# Patient Record
Sex: Female | Born: 1970
Health system: Southern US, Community
[De-identification: ages and names within clinical notes are randomized; demographics above are authoritative.]

## PROBLEM LIST (undated history)

## (undated) DIAGNOSIS — R000 Tachycardia, unspecified: Secondary | ICD-10-CM

## (undated) DIAGNOSIS — F419 Anxiety disorder, unspecified: Secondary | ICD-10-CM

## (undated) DIAGNOSIS — F4310 Post-traumatic stress disorder, unspecified: Secondary | ICD-10-CM

## (undated) DIAGNOSIS — K219 Gastro-esophageal reflux disease without esophagitis: Secondary | ICD-10-CM

## (undated) HISTORY — PX: LAPAROSCOPIC OOPHERECTOMY: SHX6507

## (undated) HISTORY — PX: VAGINAL HYSTERECTOMY: SUR661

## (undated) HISTORY — PX: CHOLECYSTECTOMY: SHX55

---

## 2003-10-15 ENCOUNTER — Other Ambulatory Visit: Payer: Self-pay

## 2003-11-22 ENCOUNTER — Ambulatory Visit: Payer: Self-pay | Admitting: Family Medicine

## 2004-10-17 ENCOUNTER — Emergency Department: Payer: Self-pay | Admitting: Internal Medicine

## 2005-10-06 ENCOUNTER — Ambulatory Visit: Payer: Self-pay | Admitting: Unknown Physician Specialty

## 2005-10-08 ENCOUNTER — Ambulatory Visit: Payer: Self-pay | Admitting: Unknown Physician Specialty

## 2006-06-24 ENCOUNTER — Ambulatory Visit: Payer: Self-pay | Admitting: Specialist

## 2006-07-16 ENCOUNTER — Emergency Department: Payer: Self-pay | Admitting: Emergency Medicine

## 2006-07-16 ENCOUNTER — Other Ambulatory Visit: Payer: Self-pay

## 2008-03-14 ENCOUNTER — Ambulatory Visit: Payer: Self-pay | Admitting: Cardiology

## 2008-05-22 ENCOUNTER — Ambulatory Visit: Payer: Self-pay | Admitting: Gastroenterology

## 2008-07-13 ENCOUNTER — Ambulatory Visit: Payer: Self-pay | Admitting: Surgery

## 2008-07-19 ENCOUNTER — Inpatient Hospital Stay: Payer: Self-pay | Admitting: Surgery

## 2009-04-06 ENCOUNTER — Emergency Department: Payer: Self-pay | Admitting: Emergency Medicine

## 2010-11-12 ENCOUNTER — Ambulatory Visit: Payer: Self-pay | Admitting: Family Medicine

## 2010-11-24 ENCOUNTER — Ambulatory Visit: Payer: Self-pay | Admitting: Family Medicine

## 2011-06-27 ENCOUNTER — Emergency Department: Payer: Self-pay | Admitting: Emergency Medicine

## 2011-06-30 ENCOUNTER — Ambulatory Visit: Payer: Self-pay | Admitting: Family Medicine

## 2011-07-09 ENCOUNTER — Ambulatory Visit: Payer: Self-pay | Admitting: Family Medicine

## 2011-08-24 ENCOUNTER — Observation Stay: Payer: Self-pay | Admitting: Student

## 2011-08-24 LAB — CBC WITH DIFFERENTIAL/PLATELET
Basophil #: 0.1 10*3/uL (ref 0.0–0.1)
Eosinophil #: 0.1 10*3/uL (ref 0.0–0.7)
HCT: 39.3 % (ref 35.0–47.0)
Lymphocyte #: 3 10*3/uL (ref 1.0–3.6)
Lymphocyte %: 28.3 %
MCH: 28.1 pg (ref 26.0–34.0)
MCHC: 32.4 g/dL (ref 32.0–36.0)
MCV: 87 fL (ref 80–100)
Monocyte #: 0.5 x10 3/mm (ref 0.2–0.9)
Monocyte %: 5 %
Neutrophil #: 6.9 10*3/uL — ABNORMAL HIGH (ref 1.4–6.5)
Platelet: 224 10*3/uL (ref 150–440)
RBC: 4.55 10*6/uL (ref 3.80–5.20)
RDW: 13 % (ref 11.5–14.5)
WBC: 10.6 10*3/uL (ref 3.6–11.0)

## 2011-08-24 LAB — COMPREHENSIVE METABOLIC PANEL
Albumin: 3.6 g/dL (ref 3.4–5.0)
Alkaline Phosphatase: 48 U/L — ABNORMAL LOW (ref 50–136)
Anion Gap: 14 (ref 7–16)
BUN: 14 mg/dL (ref 7–18)
Chloride: 107 mmol/L (ref 98–107)
EGFR (African American): >60
Glucose: 140 mg/dL — ABNORMAL HIGH (ref 65–99)
Osmolality: 288 (ref 275–301)
Potassium: 3.3 mmol/L — ABNORMAL LOW (ref 3.5–5.1)
SGOT(AST): 24 U/L (ref 15–37)
Sodium: 143 mmol/L (ref 136–145)
Total Protein: 6.8 g/dL (ref 6.4–8.2)

## 2011-08-24 LAB — URINALYSIS, COMPLETE
Bilirubin,UR: NEGATIVE
Blood: NEGATIVE
Ph: 9 (ref 4.5–8.0)
RBC,UR: 1 /HPF (ref 0–5)
Specific Gravity: 1.008 (ref 1.003–1.030)
Squamous Epithelial: NONE SEEN

## 2011-08-24 LAB — CK TOTAL AND CKMB (NOT AT ARMC)
CK, Total: 105 U/L (ref 21–215)
CK-MB: 1.2 ng/mL (ref 0.5–3.6)
CK-MB: 1 ng/mL (ref 0.5–3.6)

## 2011-08-24 LAB — TROPONIN I: Troponin-I: <0.02 ng/mL

## 2011-08-24 LAB — PROTIME-INR: Prothrombin Time: 12.2 secs (ref 11.5–14.7)

## 2011-11-26 ENCOUNTER — Ambulatory Visit: Payer: Self-pay | Admitting: Family Medicine

## 2011-12-02 ENCOUNTER — Ambulatory Visit: Payer: Self-pay | Admitting: Family Medicine

## 2012-06-01 ENCOUNTER — Ambulatory Visit: Payer: Self-pay | Admitting: Family Medicine

## 2012-07-07 ENCOUNTER — Ambulatory Visit: Payer: Self-pay | Admitting: Family Medicine

## 2013-06-12 ENCOUNTER — Ambulatory Visit: Payer: Self-pay | Admitting: Family Medicine

## 2014-02-13 DIAGNOSIS — F4310 Post-traumatic stress disorder, unspecified: Secondary | ICD-10-CM | POA: Diagnosis not present

## 2014-02-13 DIAGNOSIS — F419 Anxiety disorder, unspecified: Secondary | ICD-10-CM | POA: Diagnosis not present

## 2014-04-02 DIAGNOSIS — J019 Acute sinusitis, unspecified: Secondary | ICD-10-CM | POA: Diagnosis not present

## 2014-04-02 DIAGNOSIS — J00 Acute nasopharyngitis [common cold]: Secondary | ICD-10-CM | POA: Diagnosis not present

## 2014-04-03 DIAGNOSIS — E875 Hyperkalemia: Secondary | ICD-10-CM | POA: Diagnosis not present

## 2014-04-03 DIAGNOSIS — R002 Palpitations: Secondary | ICD-10-CM | POA: Diagnosis not present

## 2014-05-22 NOTE — Consult Note (Signed)
PATIENT NAME:  Ariel Bryant, SPIEWAK MR#:  [id number] DATE OF BIRTH:  02-Jul-1970  DATE OF CONSULTATION:  08/24/2011  REFERRING PHYSICIAN:  Dr. Bridgette Habermann  CONSULTING PHYSICIAN:  Corey Skains, MD  REASON FOR CONSULTATION: Syncope with palpitations and hypertension.   CHIEF COMPLAINT: "I have palpitations."   HISTORY OF PRESENT ILLNESS: This is a 44 year old female with known palpitations and  possible irregular heart beat with preventricular contractions, who has had an echocardiogram and stress test last year showing no evidence of significant rhythm disturbances other than preventricular contractions. The patient has had some borderline hypertension but has not  required further intervention. In addition to that, the patient has been working on her cholesterol control with diet. The patient had a new onset of episodes of palpitations, irregular heartbeat, and weakness over the weekend with which she awakened in the morning and went to the bathroom. At that time she had some weakness and dizziness, chest discomfort, and passed out. After she passed out she did have some improvement of her symptoms and was seen in the Emergency Room. After that she did not have any further episodes of significant symptoms of chest pain or shortness of breath. Troponin and CK-MB are within normal limits. The patient has had an EKG showing normal sinus rhythm.   REVIEW OF SYSTEMS: The remainder of her review of systems is negative for vision change, ringing in the ears, hearing loss, cough, congestion, heartburn, nausea, vomiting, diarrhea, bloody stools, stomach pain, extremity pain, leg weakness, cramping of the buttocks, known blood clots, headaches, blackouts, dizzy spells, nosebleed, congestion, trouble swallowing, frequent urination, urination at night, muscle weakness, numbness, anxiety, depression, skin lesions, or skin rashes.   PAST MEDICAL HISTORY:  1. Palpitations with preventricular contractions.   2. Hypertension.  3. Hyperlipidemia.  4. Gastroesophageal reflux.   FAMILY HISTORY: No family members with early onset of cardiovascular disease.   SOCIAL HISTORY: Currently denies alcohol or tobacco use.   ALLERGIES: No known drug allergies.   CURRENT MEDICATIONS: As listed.   PHYSICAL EXAMINATION:  VITAL SIGNS: Blood pressure 126/68 bilaterally, heart rate 72 upright, reclining, and regular.   GENERAL: She is a well-appearing female in no acute distress.   HEENT: No icterus, thyromegaly, ulcers, hemorrhage, or xanthelasma.   HEART: Regular rate and rhythm. Normal S1, S2 without murmur, gallop, or rub. Point of maximal impulse is normal size and placement. Carotid upstroke normal without bruit. Jugular venous pressure is normal.   LUNGS: Lungs have few basilar crackles with normal respirations.   ABDOMEN: Soft, nontender, without hepatosplenomegaly or masses. Abdominal aorta is normal size without bruit.   EXTREMITIES: 2+ bilateral pulses in dorsal pedal, radial, and femoral arteries without lower extremity edema, cyanosis, clubbing, or ulcers.   NEUROLOGIC: She is oriented to time, place, and person with normal mood and affect.   ASSESSMENT: This is a 43 year old female with borderline hypertension, hyperlipidemia, palpitations, with possible rhythm disturbances, no evidence of significant further symptoms at this time and no evidence of EKG changes or myocardial infarction.   RECOMMENDATIONS:  1. Continue to monitor, following for rhythm disturbances.  2. Treadmill EKG to assess for myocardial infarction and rhythm disturbances.  3. Consider a monitor at home for further evaluation of tachycardia.  4. No additional medication management for hypertension control due to concerns of hypotension and dizziness.    5. Lipid management with a goal LDL below 70 mg/dL if able.      ____________________________ Corey Skains, MD bjk:bjt D: 08/24/2011 15:37:18  ET T: 08/24/2011 17:43:01 ET JOB#: [id number]  cc: Corey Skains, MD, <Dictator> Corey Skains MD ELECTRONICALLY SIGNED 08/26/2011 13:38

## 2014-05-22 NOTE — Discharge Summary (Signed)
PATIENT NAME:  Ariel Bryant, Ariel Bryant MR#:  [id number] DATE OF BIRTH:  1970-11-15  DATE OF ADMISSION:  08/24/2011 DATE OF DISCHARGE:  08/24/2011  CONSULTANT: Dr. Nehemiah Massed from cardiology.   CHIEF COMPLAINT: Syncope.   DISCHARGE DIAGNOSES:  1. Syncope possibly from arrhythmia, less likely situational syncope from micturition.  2. Hypokalemia.   SECONDARY DIAGNOSES:  1. History of palpitations and tachycardia in the past.  2. History of cervical dysplasia.   DISCHARGE MEDICATIONS:  1. Prenatal vitamin 1 tab daily.  2. Alprazolam 1 mg 0.5 tab as needed.  3. OTC magnesium 1 tab daily.  4. OTC potassium 1 tab daily.  5. Calcium 600 with vitamin D 200 international units 1 tab daily.  6. Metoprolol tartrate 12.5 mg orally as needed. 7. Qvar 80 mcg inhaled 1 puff inhaled two times a day.  8. Lovaza 1000 mg 1 tab with each meal.   DIET: Low fat, low cholesterol.   ACTIVITY: As tolerated.   FOLLOW UP: Please follow up with Dr. Nehemiah Massed within a week. An appointment will be made for you.   DISPOSITION: Home.   HISTORY OF PRESENT ILLNESS: Patient is a pleasant 44 year old female with history of tachycardia in the past who follows with Dr. Saralyn Pilar. For full details of history and physical, please see the dictation on 08/24/2011 by Dr. Allyson Sabal. She did have a syncopal episode the night of admission. Per husband who witnessed the event it was perhaps several minutes and patient was admitted to the hospitalist service for further evaluation and management for the syncopal episode. There was no seizure-like activity. Patient did have palpitations prior to the syncopal episode. Patient had no chest pains.   LABORATORY, DIAGNOSTIC AND RADIOLOGICAL DATA: Potassium 3.3, troponins negative x2. LFTs: Alkaline phosphatase 48 otherwise within normal limits. Creatinine 0.67, BUN 14. CBC within normal limits. D-dimer negative. Urinalysis not suggestive of infection. Stress test negative per Dr. Nehemiah Massed. CT of the  head without contrast: No acute intracranial process.   HOSPITAL COURSE: Patient was admitted to the hospitalist service and placed on remote telemetry and admitted for observation. She had negative troponins x2 and was taken to a stress test which was also negative for acute ischemia. She did not have any significant arrhythmias here. Patient was seen by Dr. Nehemiah Massed from cardiology. Upon further conversations patient has been having a feeling of the heart racing at times and at times has to do vagal maneuvers as an outpatient to stop the racing heart and palpitations. Patient's blood pressure is on the lower side and does not allow for around-the-clock daily rate control agents. That is why she is on low dose metoprolol as needed. The case was discussed with Dr. Nehemiah Massed who will arrange for a 48 hour event monitor. She will follow up with Dr. Nehemiah Massed later this week. Perhaps she would need a longer period of monitoring for arrhythmias. At this point, she will be discharged with outpatient follow up with Dr. Nehemiah Massed. Patient did have hypokalemia which was repleted.   DISPOSITION: Home.   CODE STATUS: Patient is FULL CODE.  ____________________________ Vivien Presto, MD sa:cms D: 08/25/2011 13:21:40 ET T: 08/26/2011 10:14:11 ET JOB#: [id number]  cc: Vivien Presto, MD, <Dictator> Vivien Presto MD ELECTRONICALLY SIGNED 08/28/2011 19:12

## 2014-05-22 NOTE — H&P (Signed)
PATIENT NAME:  Ariel Bryant, Ariel Bryant MR#:  [id number] DATE OF BIRTH:  10/31/1970  DATE OF ADMISSION:  08/24/2011  PRIMARY CARE PHYSICIAN: Dr. Denton Lank   PRIMARY CARDIOLOGIST: Dr. Clayborn Bigness    CHIEF COMPLAINT: Syncope.   SUBJECTIVE: This is a 44 year old female with a history of tachycardia followed by Dr. Clayborn Bigness in the past who presents to the ER after an episode of syncope that lasted about 5 to 10 seconds at around 2 a.m. this morning. The patient woke up to go to the bathroom. While she was micturating, the patient stated that she felt like she would pass out and she subsequently passed out for several seconds during which the patient's husband called EMS. The patient denied any chest pain or palpitations prior to the syncopal episode. The patient did not have any seizure-like activity. The patient has not had any similar episodes before. According to the patient she had a stress test and Holter monitor both of which were negative. She was diagnosed with tachycardia and was started on metoprolol but the metoprolol is only on a p.r.n. basis and it was changed to p.r.n. because the patient's heart rate would drop into the low 50's and her blood pressure would also drop. Subsequently her cardiologist decided to make her metoprolol just p.r.n. The patient also states that she has been feeling queasy and sick to her stomach over the last couple of days. She has not had any diarrhea. She has not had any fever. She denies any sore throat or URI-like symptoms.   PAST MEDICAL HISTORY:  1. Palpitations in the past. She has been on Inderal and now on metoprolol only as needed.  2. History of cervical dysplasia.   HOME MEDICATIONS: Metoprolol as needed 12.5 mg daily.   ALLERGIES: Sulfa, Septra, Doxycycline, erythromycin, codeine.   SOCIAL HISTORY: She smokes a pack of cigarettes a day.   REVIEW OF SYSTEMS: CONSTITUTIONAL: No fever, fatigue, weakness, pain, weight loss or weight gain. EYES: No blurry vision,  double vision, redness, inflammation. ENT: No tinnitus, ear pain, hearing loss, allergies. RESPIRATORY: No cough, wheezing, hemoptysis, dyspnea, painful respiration. CARDIOVASCULAR: No chest pain, orthopnea, edema, arrhythmia but positive for palpitations and syncope. GI: Positive for nausea but no vomiting, diarrhea, or abdominal pain. GU: No dysuria, hematuria, or renal calculi. ENDOCRINE: No polyuria, nocturia, thyroid problems. HEME: No anemia, easy bruising, bleeding, or swollen glands. INTEGUMENTARY: No acne, rash, lesions, or change in mole or hair. MUSCULOSKELETAL: No swelling, gout, redness, or limited activity. NEUROLOGIC: No numbness, weakness, dysarthria, epilepsy. PSYCH: No anxiety, insomnia, bipolar disorder, schizophrenia, or nervousness.   FAMILY HISTORY: Negative for coronary artery disease, hypertension, diabetes.   PHYSICAL EXAMINATION:   VITAL SIGNS: Blood pressure 105/55, pulse 111.   GENERAL: Comfortable in no acute cardiopulmonary distress.   HEENT: Pupils equal and reactive to light. Extraocular movements intact.   NECK: Supple. No JVD. No carotid bruit.   LUNGS: Clear to auscultation bilaterally. No wheezes, crackles, or rhonchi. No accessory muscle use.   CARDIOVASCULAR: Regular rate and rhythm. No murmurs, rubs, or gallops. PMI nondisplaced.   ABDOMEN: Soft, nontender, nondistended. Normoactive bowel sounds. Murphy sign is negative. No rebound. No guarding.   NEUROLOGIC: Cranial nerves II through XII grossly intact. Deep tendon reflexes 2+ bilaterally. Motor strength intact in bilateral upper and lower extremities.   SKIN: Without any skin rashes.   LYMPHATIC: No cervical lymphadenopathy.   LABORATORY, DIAGNOSTIC, AND RADIOLOGICAL DATA: D-dimer negative. TSH within normal range. INR normal. Troponin negative at 0.02. Glucose  140, BUN 14, creatinine 0.67, sodium 143, potassium 3.3, chloride 104, bicarb 22, alkaline phosphatase 48, ALT 24, AST 24, anion gap 14, WBC  10.6, hemoglobin 11.8, hematocrit 39.3, platelet count 224.   ASSESSMENT AND PLAN:  1. Syncope likely secondary to either micturition syncope or vasovagal syncope.  2. History of tachycardia.  3. History of cervical dysplasia.  4. Anxiety.   PLAN:  1. The patient will be admitted to telemetry for observation overnight.  2. We will cycle cardiac enzymes.  3. Obtain a 2-D echo in the morning. 4. If her work-up is unrevealing, the patient can safely be discharged home and follow-up with Cardiology. She has not had any chest pain or shortness of breath and her D-dimer is negative and chest x-ray is clear and there is no underlying pneumonia. The patient will also be hydrated and will hydrate her with IV fluids. Anticipate discharge in the morning.   CODE STATUS: She is a FULL CODE.   TIME SPENT: About 60 minutes.   ____________________________ Reyne Dumas, MD na:drc D: 08/24/2011 05:14:23 ET T: 08/24/2011 06:18:38 ET JOB#: [id number]  cc: Reyne Dumas, MD, <Dictator> Sarah "Sallie" Posey Pronto, MD Reyne Dumas MD ELECTRONICALLY SIGNED 08/26/2011 3:09

## 2014-06-06 DIAGNOSIS — F411 Generalized anxiety disorder: Secondary | ICD-10-CM | POA: Diagnosis not present

## 2014-06-06 DIAGNOSIS — F42 Obsessive-compulsive disorder: Secondary | ICD-10-CM | POA: Diagnosis not present

## 2014-06-06 DIAGNOSIS — F4323 Adjustment disorder with mixed anxiety and depressed mood: Secondary | ICD-10-CM | POA: Diagnosis not present

## 2014-06-08 DIAGNOSIS — F4323 Adjustment disorder with mixed anxiety and depressed mood: Secondary | ICD-10-CM | POA: Diagnosis not present

## 2014-06-08 DIAGNOSIS — F42 Obsessive-compulsive disorder: Secondary | ICD-10-CM | POA: Diagnosis not present

## 2014-06-08 DIAGNOSIS — F4310 Post-traumatic stress disorder, unspecified: Secondary | ICD-10-CM | POA: Diagnosis not present

## 2014-06-08 DIAGNOSIS — F411 Generalized anxiety disorder: Secondary | ICD-10-CM | POA: Diagnosis not present

## 2014-06-11 DIAGNOSIS — G4700 Insomnia, unspecified: Secondary | ICD-10-CM | POA: Diagnosis not present

## 2014-06-11 DIAGNOSIS — F418 Other specified anxiety disorders: Secondary | ICD-10-CM | POA: Diagnosis not present

## 2014-06-21 DIAGNOSIS — R002 Palpitations: Secondary | ICD-10-CM | POA: Diagnosis not present

## 2014-06-21 DIAGNOSIS — E875 Hyperkalemia: Secondary | ICD-10-CM | POA: Diagnosis not present

## 2014-06-21 DIAGNOSIS — G4700 Insomnia, unspecified: Secondary | ICD-10-CM | POA: Diagnosis not present

## 2014-06-28 DIAGNOSIS — R002 Palpitations: Secondary | ICD-10-CM | POA: Diagnosis not present

## 2014-06-28 DIAGNOSIS — I10 Essential (primary) hypertension: Secondary | ICD-10-CM | POA: Diagnosis not present

## 2014-06-28 DIAGNOSIS — R000 Tachycardia, unspecified: Secondary | ICD-10-CM | POA: Diagnosis not present

## 2014-06-28 DIAGNOSIS — I493 Ventricular premature depolarization: Secondary | ICD-10-CM | POA: Insufficient documentation

## 2014-10-05 ENCOUNTER — Encounter: Payer: Self-pay | Admitting: Emergency Medicine

## 2014-10-05 ENCOUNTER — Ambulatory Visit
Admission: EM | Admit: 2014-10-05 | Discharge: 2014-10-05 | Disposition: A | Discharge status: Home / Self Care | Payer: Managed Care, Other (non HMO) | Attending: Family Medicine | Admitting: Family Medicine

## 2014-10-05 DIAGNOSIS — F419 Anxiety disorder, unspecified: Secondary | ICD-10-CM | POA: Insufficient documentation

## 2014-10-05 DIAGNOSIS — J029 Acute pharyngitis, unspecified: Secondary | ICD-10-CM | POA: Insufficient documentation

## 2014-10-05 DIAGNOSIS — H9209 Otalgia, unspecified ear: Secondary | ICD-10-CM | POA: Diagnosis present

## 2014-10-05 DIAGNOSIS — H6982 Other specified disorders of Eustachian tube, left ear: Secondary | ICD-10-CM | POA: Insufficient documentation

## 2014-10-05 DIAGNOSIS — K219 Gastro-esophageal reflux disease without esophagitis: Secondary | ICD-10-CM | POA: Insufficient documentation

## 2014-10-05 DIAGNOSIS — Z79899 Other long term (current) drug therapy: Secondary | ICD-10-CM | POA: Insufficient documentation

## 2014-10-05 DIAGNOSIS — F411 Generalized anxiety disorder: Secondary | ICD-10-CM | POA: Diagnosis not present

## 2014-10-05 DIAGNOSIS — F330 Major depressive disorder, recurrent, mild: Secondary | ICD-10-CM | POA: Diagnosis not present

## 2014-10-05 DIAGNOSIS — F4310 Post-traumatic stress disorder, unspecified: Secondary | ICD-10-CM | POA: Diagnosis not present

## 2014-10-05 HISTORY — DX: Post-traumatic stress disorder, unspecified: F43.10

## 2014-10-05 HISTORY — DX: Gastro-esophageal reflux disease without esophagitis: K21.9

## 2014-10-05 HISTORY — DX: Anxiety disorder, unspecified: F41.9

## 2014-10-05 LAB — RAPID STREP SCREEN (MED CTR MEBANE ONLY): STREPTOCOCCUS, GROUP A SCREEN (DIRECT): NEGATIVE

## 2014-10-05 MED ORDER — IBUPROFEN 800 MG PO TABS: 800.0000 mg | ORAL_TABLET | Freq: Three times a day (TID) | ORAL | Status: DC | PRN

## 2014-10-05 MED ORDER — IBUPROFEN 800 MG PO TABS
800.0000 mg | ORAL_TABLET | Freq: Once | ORAL | Status: AC
  Administered 2014-10-05: 800 mg via ORAL

## 2014-10-05 MED ORDER — AZITHROMYCIN 250 MG PO TABS: 250.0000 mg | ORAL_TABLET | Freq: Every day | ORAL | Status: DC

## 2014-10-05 NOTE — Discharge Instructions (Signed)
Azithromycin 2 tablets STAT then 1 tablet daily for days 2-3-4-5- Flonase  1 spray per nostril 2 x per day x 3-5 days then reduce to daily Ibuprofen 600-800 up to 3 x day with meals.Marland Kitchenatternate with tylenol as desired     Barotitis Media Barotitis media is inflammation of your middle ear. This occurs when the auditory tube (eustachian tube) leading from the back of your nose (nasopharynx) to your eardrum is blocked. This blockage may result from a cold, environmental allergies, or an upper respiratory infection. Unresolved barotitis media may lead to damage or hearing loss (barotrauma), which may become permanent. HOME CARE INSTRUCTIONS   Use medicines as recommended by your health care provider. Over-the-counter medicines will help unblock the canal and can help during times of air travel.  Do not put anything into your ears to clean or unplug them. Eardrops will not be helpful.  Do not swim, dive, or fly until your health care provider says it is all right to do so. If these activities are necessary, chewing gum with frequent, forceful swallowing may help. It is also helpful to hold your nose and gently blow to pop your ears for equalizing pressure changes. This forces air into the eustachian tube.  Only take over-the-counter or prescription medicines for pain, discomfort, or fever as directed by your health care provider.  A decongestant may be helpful in decongesting the middle ear and make pressure equalization easier. SEEK MEDICAL CARE IF:  You experience a serious form of dizziness in which you feel as if the room is spinning and you feel nauseated (vertigo).  Your symptoms only involve one ear. SEEK IMMEDIATE MEDICAL CARE IF:   You develop a severe headache, dizziness, or severe ear pain.  You have bloody or pus-like drainage from your ears.  You develop a fever.  Your problems do not improve or become worse. MAKE SURE YOU:   Understand these instructions.  Will watch  your condition.  Will get help right away if you are not doing well or get worse. Document Released: 01/17/2000 Document Revised: 11/09/2012 Document Reviewed: 08/16/2012 [facility] Patient Information 2015 [place], [place]. This information is not intended to replace advice given to you by your health care provider. Make sure you discuss any questions you have with your health care provider. Pharyngitis Pharyngitis is redness, pain, and swelling (inflammation) of your pharynx.  CAUSES  Pharyngitis is usually caused by infection. Most of the time, these infections are from viruses (viral) and are part of a cold. However, sometimes pharyngitis is caused by bacteria (bacterial). Pharyngitis can also be caused by allergies. Viral pharyngitis may be spread from person to person by coughing, sneezing, and personal items or utensils (cups, forks, spoons, toothbrushes). Bacterial pharyngitis may be spread from person to person by more intimate contact, such as kissing.  SIGNS AND SYMPTOMS  Symptoms of pharyngitis include:   Sore throat.   Tiredness (fatigue).   Low-grade fever.   Headache.  Joint pain and muscle aches.  Skin rashes.  Swollen lymph nodes.  Plaque-like film on throat or tonsils (often seen with bacterial pharyngitis). DIAGNOSIS  Your health care provider will ask you questions about your illness and your symptoms. Your medical history, along with a physical exam, is often all that is needed to diagnose pharyngitis. Sometimes, a rapid strep test is done. Other lab tests may also be done, depending on the suspected cause.  TREATMENT  Viral pharyngitis will usually get better in 3-4 days without the use of  medicine. Bacterial pharyngitis is treated with medicines that kill germs (antibiotics).  HOME CARE INSTRUCTIONS   Drink enough water and fluids to keep your urine clear or pale yellow.   Only take over-the-counter or prescription medicines as directed by your health care  provider:   If you are prescribed antibiotics, make sure you finish them even if you start to feel better.   Do not take aspirin.   Get lots of rest.   Gargle with 8 oz of salt water ( tsp of salt per 1 qt of water) as often as every 1-2 hours to soothe your throat.   Throat lozenges (if you are not at risk for choking) or sprays may be used to soothe your throat. SEEK MEDICAL CARE IF:   You have large, tender lumps in your neck.  You have a rash.  You cough up green, yellow-brown, or bloody spit. SEEK IMMEDIATE MEDICAL CARE IF:   Your neck becomes stiff.  You drool or are unable to swallow liquids.  You vomit or are unable to keep medicines or liquids down.  You have severe pain that does not go away with the use of recommended medicines.  You have trouble breathing (not caused by a stuffy nose). MAKE SURE YOU:   Understand these instructions.  Will watch your condition.  Will get help right away if you are not doing well or get worse. Document Released: 01/19/2005 Document Revised: 11/09/2012 Document Reviewed: 09/26/2012 [facility] Patient Information 2015 [place], [place]. This information is not intended to replace advice given to you by your health care provider. Make sure you discuss any questions you have with your health care provider.

## 2014-10-05 NOTE — ED Provider Notes (Signed)
CSN: [id number]     Arrival date & time 10/05/14  1232 History   First MD Initiated Contact with Patient 10/05/14 1402     Chief Complaint  Patient presents with  . Sore Throat  . Otalgia   (Consider location/radiation/quality/duration/timing/severity/associated sxs/prior Treatment) HPI 44 yo F with sore throat, enlarged tonsils with "white patches" and generlized body ache / fatigue presents for evaluation. Ears feel full, left uncomfortable. Feels warm, hasn't taken temp  Past Medical History  Diagnosis Date  . Anxiety   . PTSD (post-traumatic stress disorder)   . GERD (gastroesophageal reflux disease)    Past Surgical History  Procedure Laterality Date  . Cesarean section    . Abdominal hysterectomy    . Laparoscopic oopherectomy    . Cholecystectomy     Family History  Problem Relation Age of Onset  . Hypertension Father    Social History  Substance Use Topics  . Smoking status: Never Smoker   . Smokeless tobacco: None  . Alcohol Use: No   OB History    No data available     Review of Systems  Constitutional: . Baseline level of activity. tired Eyes: No visual changes. No red eyes/discharge. HKV:QQVZ throat, left ear pressure/pain Cardiovascular:Negative for chest pain/palpitations Respiratory: Negative for shortness of breath Gastrointestinal: No abdominal pain. No nausea,vomiting.No Diarrhea.No constipation. Genitourinary: Negative for dysuria.Normal urination. Musculoskeletal: Negative for back pain. FROM extremities without pain Skin: Negative for rash Neurological: Negative for headache, focal weakness or numbness   Allergies  Codeine; Sulfa antibiotics; and Doxycycline  Home Medications   Prior to Admission medications   Medication Sig Start Date End Date Taking? Authorizing Provider  acyclovir (ZOVIRAX) 400 MG tablet Take 400 mg by mouth as needed.   Yes Historical Provider, MD  ALPRAZolam Duanne Moron) 1 MG tablet Take 1 mg by mouth at bedtime as  needed for anxiety.   Yes Historical Provider, MD  Ascorbic Acid (VITAMIN C) 1000 MG tablet Take 1,000 mg by mouth daily.   Yes Historical Provider, MD  Biotin 1 MG CAPS Take 1 capsule by mouth daily.   Yes Historical Provider, MD  buPROPion (WELLBUTRIN XL) 300 MG 24 hr tablet Take 300 mg by mouth daily.   Yes Historical Provider, MD  Calcium Carb-Cholecalciferol (CALCIUM + D3) 600-200 MG-UNIT TABS Take 1 tablet by mouth daily.   Yes Historical Provider, MD  metoprolol succinate (TOPROL-XL) 25 MG 24 hr tablet Take 12.5 mg by mouth as needed.   Yes Historical Provider, MD  omega-3 acid ethyl esters (LOVAZA) 1 G capsule Take 1 g by mouth 2 (two) times daily.   Yes Historical Provider, MD  omeprazole (PRILOSEC) 40 MG capsule Take 40 mg by mouth daily.   Yes Historical Provider, MD  Prenatal Vit-Fe Fumarate-FA (PRENATAL VITAMIN PO) Take 1 tablet by mouth daily.   Yes Historical Provider, MD  azithromycin (ZITHROMAX) 250 MG tablet Take 1 tablet (250 mg total) by mouth daily. Take first 2 tablets together, then 1 every day until finished. 10/05/14   Jan Fireman, PA-C  ibuprofen (ADVIL,MOTRIN) 800 MG tablet Take 1 tablet (800 mg total) by mouth every 8 (eight) hours as needed. 10/05/14   Jan Fireman, PA-C   Meds Ordered and Administered this Visit   Medications  ibuprofen (ADVIL,MOTRIN) tablet 800 mg (800 mg Oral Given 10/05/14 1430)    BP 113/64 mmHg  Pulse 89  Temp(Src) 99.3 F (37.4 C) (Tympanic)  Resp 16  Ht 5\' 4"  (1.626 m)  Wt  160 lb (72.576 kg)  BMI 27.45 kg/m2  SpO2 99% No data found.   Physical Exam    Constitutional -alert and oriented, in mild distress Head-atraumatic, normocephalic Eyes- conjunctiva normal, EOMI ,conjugate gaze Ears- canals neg, right TM clear, left TM with light reflex deviation and retraction Nose- no congestion or rhinorrhea,  sinus pressure / no pain Mouth/throat- mucous membranes moist ,oropharynx deep erythema, tonsils 2-3 + with bilateral exudate; Stat  Strep neg Neck- supple without glandular enlargement CV- regular rate, grossly normal heart sounds, p89 Resp-no distress, normal respiratory effort,clear to auscultation bilaterally GI- no distention MSK- no tender, normal ROM, all extremities, ambulatory, self-care Neuro- normal speech and language, no gross focal neurological deficit appreciated,  Skin-warm,dry ,intact; no rash noted Psych-mood and affect grossly normal; speech and behavior grossly normal  ED Course  Procedures (including critical care time)  Labs Review Labs Reviewed  RAPID STREP SCREEN (NOT AT [facility])  CULTURE, GROUP A STREP ([place])   Results for orders placed or performed during the hospital encounter of 10/05/14  Rapid strep screen  Result Value Ref Range   Streptococcus, Group A Screen (Direct) NEGATIVE NEGATIVE     Imaging Review No results found.      MDM   1. Pharyngitis   2. Eustachian tube dysfunction, left    Plan: 1. Test results and diagnosis reviewed with patient- call for 3 day report- atb coverage for significant pharyngitis with eustachian tube dysfunction 2. rx as per orders; risks, benefits, potential side effects reviewed with patient 3. Recommend supportive treatment with cyclic ibuprofen/tylenol, salt water gargles, hydration 4. F/u prn if symptoms worsen or don't improve  Discharge Medication List as of 10/05/2014  2:30 PM    START taking these medications   Details  azithromycin (ZITHROMAX) 250 MG tablet Take 1 tablet (250 mg total) by mouth daily. Take first 2 tablets together, then 1 every day until finished., Starting 10/05/2014, Until Discontinued, Normal    ibuprofen (ADVIL,MOTRIN) 800 MG tablet Take 1 tablet (800 mg total) by mouth every 8 (eight) hours as needed., Starting 10/05/2014, Until Discontinued, Normal        Jan Fireman, PA-C 10/06/14 0410

## 2014-10-05 NOTE — ED Notes (Signed)
Patient c/o sore throat and right ear pain since yesterday.  Patient reports bodyaches.  Patient denies fevers.

## 2014-10-06 ENCOUNTER — Encounter: Payer: Self-pay | Admitting: Physician Assistant

## 2014-10-08 LAB — CULTURE, GROUP A STREP (THRC)

## 2014-10-12 DIAGNOSIS — F411 Generalized anxiety disorder: Secondary | ICD-10-CM | POA: Diagnosis not present

## 2014-10-24 ENCOUNTER — Telehealth: Payer: Self-pay

## 2014-10-24 NOTE — ED Notes (Signed)
Patient called and stated that she was not improving. Strep Test was positive and Dr. Zenda Alpers MD stated to call in Amoxicillin 875mg  by mouth twice a day. Patient was advised and medication was called in.  [facility]

## 2014-11-05 DIAGNOSIS — Z23 Encounter for immunization: Secondary | ICD-10-CM | POA: Diagnosis not present

## 2014-11-19 ENCOUNTER — Ambulatory Visit
Admission: EM | Admit: 2014-11-19 | Discharge: 2014-11-19 | Disposition: A | Discharge status: Home / Self Care | Payer: Medicare Other | Attending: Family Medicine | Admitting: Family Medicine

## 2014-11-19 ENCOUNTER — Encounter: Payer: Self-pay | Admitting: Emergency Medicine

## 2014-11-19 DIAGNOSIS — K219 Gastro-esophageal reflux disease without esophagitis: Secondary | ICD-10-CM | POA: Insufficient documentation

## 2014-11-19 DIAGNOSIS — F4310 Post-traumatic stress disorder, unspecified: Secondary | ICD-10-CM | POA: Insufficient documentation

## 2014-11-19 DIAGNOSIS — H6983 Other specified disorders of Eustachian tube, bilateral: Secondary | ICD-10-CM | POA: Insufficient documentation

## 2014-11-19 DIAGNOSIS — Z881 Allergy status to other antibiotic agents status: Secondary | ICD-10-CM | POA: Insufficient documentation

## 2014-11-19 DIAGNOSIS — F419 Anxiety disorder, unspecified: Secondary | ICD-10-CM | POA: Diagnosis not present

## 2014-11-19 DIAGNOSIS — J029 Acute pharyngitis, unspecified: Secondary | ICD-10-CM | POA: Diagnosis present

## 2014-11-19 DIAGNOSIS — J039 Acute tonsillitis, unspecified: Secondary | ICD-10-CM

## 2014-11-19 DIAGNOSIS — Z79899 Other long term (current) drug therapy: Secondary | ICD-10-CM | POA: Insufficient documentation

## 2014-11-19 LAB — RAPID STREP SCREEN (MED CTR MEBANE ONLY): Streptococcus, Group A Screen (Direct): NEGATIVE

## 2014-11-19 MED ORDER — IBUPROFEN 800 MG PO TABS: 800.0000 mg | ORAL_TABLET | Freq: Three times a day (TID) | ORAL | Status: DC

## 2014-11-19 MED ORDER — PENICILLIN V POTASSIUM 500 MG PO TABS: 500.0000 mg | ORAL_TABLET | Freq: Two times a day (BID) | ORAL | Status: DC

## 2014-11-19 MED ORDER — FIRST-DUKES MOUTHWASH MT SUSP: 20.0000 mL | Freq: Three times a day (TID) | OROMUCOSAL | Status: DC | PRN

## 2014-11-19 NOTE — Discharge Instructions (Signed)

## 2014-11-19 NOTE — ED Provider Notes (Addendum)
CSN: [id number]     Arrival date & time 11/19/14  1820 History   First MD Initiated Contact with Patient 11/19/14 1921     Chief Complaint  Patient presents with  . Sore Throat   (Consider location/radiation/quality/duration/timing/severity/associated sxs/prior Treatment) HPI Comments: Married caucasian female here for evaluation of sore throat.  Had positive throat culture Group F Strep 24 Oct 2014 was initially on azithromycin symptoms did not improve switched to amoxicillin but patient felt she never got completely better and symptoms worsening again feels like before she started azithromycin.  Tonsils enlarged has not seen exudate.  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    The history is provided by the patient and the spouse.    Past Medical History  Diagnosis Date  . Anxiety   . PTSD (post-traumatic stress disorder)   . GERD (gastroesophageal reflux disease)    Past Surgical History  Procedure Laterality Date  . Cesarean section      NSD x 3 followed by C/S for 8 + pounds breech  . Laparoscopic oopherectomy    . Cholecystectomy    . Vaginal hysterectomy      had BTL, then VH, later developed ovarian mass and had ooph at 44 yo   Family History  Problem Relation Age of Onset  . Hypertension Father    Social History  Substance Use Topics  . Smoking status: Never Smoker   . Smokeless tobacco: None  . Alcohol Use: No   OB History    Gravida Para Term Preterm AB TAB SAB Ectopic Multiple Living   4 4             Review of Systems  Constitutional: Negative for fever, chills, diaphoresis, activity change, appetite change, fatigue and unexpected weight change.  HENT: Positive for ear pain and sore throat. Negative for congestion, dental problem, drooling, ear discharge, facial swelling, hearing loss, mouth sores, nosebleeds, postnasal drip, rhinorrhea, sinus pressure, sneezing, tinnitus, trouble swallowing and voice change.   Eyes: Negative for photophobia, pain, discharge, redness, itching  and visual disturbance.  Respiratory: Negative for cough, shortness of breath, wheezing and stridor.   Cardiovascular: Negative for chest pain, palpitations and leg swelling.  Gastrointestinal: Negative for nausea, vomiting, abdominal pain, diarrhea, constipation, blood in stool and abdominal distention.  Endocrine: Negative for cold intolerance and heat intolerance.  Genitourinary: Negative for dysuria.  Musculoskeletal: Negative for myalgias, back pain, joint swelling, arthralgias, gait problem, neck pain and neck stiffness.  Skin: Negative for color change, pallor, rash and wound.  Allergic/Immunologic: Negative for environmental allergies and food allergies.  Neurological: Negative for dizziness, tremors, seizures, syncope, facial asymmetry, speech difficulty, weakness, light-headedness, numbness and headaches.  Hematological: Negative for adenopathy. Does not bruise/bleed easily.  Psychiatric/Behavioral: Negative for behavioral problems, confusion, sleep disturbance and agitation.    Allergies  Codeine; Sulfa antibiotics; and Doxycycline  Home Medications   Prior to Admission medications   Medication Sig Start Date End Date Taking? Authorizing Provider  acyclovir (ZOVIRAX) 400 MG tablet Take 400 mg by mouth as needed.    Historical Provider, MD  ALPRAZolam Duanne Moron) 1 MG tablet Take 1 mg by mouth at bedtime as needed for anxiety.    Historical Provider, MD  Ascorbic Acid (VITAMIN C) 1000 MG tablet Take 1,000 mg by mouth daily.    Historical Provider, MD  Biotin 1 MG CAPS Take 1 capsule by mouth daily.    Historical Provider, MD  buPROPion (WELLBUTRIN XL) 300 MG 24 hr tablet Take 300 mg by mouth  daily.    Historical Provider, MD  Calcium Carb-Cholecalciferol (CALCIUM + D3) 600-200 MG-UNIT TABS Take 1 tablet by mouth daily.    Historical Provider, MD  Diphenhyd-Hydrocort-Nystatin (FIRST-DUKES MOUTHWASH) SUSP Use as directed 20 mLs in the mouth or throat 3 (three) times daily as needed.  11/19/14   Olen Cordial, NP  ibuprofen (ADVIL,MOTRIN) 800 MG tablet Take 1 tablet (800 mg total) by mouth every 8 (eight) hours as needed. 10/05/14   Jan Fireman, PA-C  ibuprofen (ADVIL,MOTRIN) 800 MG tablet Take 1 tablet (800 mg total) by mouth 3 (three) times daily. 11/19/14   Olen Cordial, NP  metoprolol succinate (TOPROL-XL) 25 MG 24 hr tablet Take 12.5 mg by mouth as needed.    Historical Provider, MD  omega-3 acid ethyl esters (LOVAZA) 1 G capsule Take 1 g by mouth 2 (two) times daily.    Historical Provider, MD  omeprazole (PRILOSEC) 40 MG capsule Take 40 mg by mouth daily.    Historical Provider, MD  penicillin v potassium (VEETID) 500 MG tablet Take 1 tablet (500 mg total) by mouth 2 (two) times daily. 11/19/14   Olen Cordial, NP  Prenatal Vit-Fe Fumarate-FA (PRENATAL VITAMIN PO) Take 1 tablet by mouth daily.    Historical Provider, MD   Meds Ordered and Administered this Visit  Medications - No data to display  BP 132/79 mmHg  Pulse 78  Temp(Src) 98.1 F (36.7 C) (Tympanic)  Resp 16  Ht 5\' 4"  (1.626 m)  Wt 160 lb (72.576 kg)  BMI 27.45 kg/m2  SpO2 100% No data found.   Physical Exam  Constitutional: She is oriented to person, place, and time. Vital signs are normal. She appears well-developed and well-nourished. No distress.  HENT:  Head: Normocephalic and atraumatic.    Right Ear: External ear and ear canal normal. A middle ear effusion is present.  Left Ear: Hearing, external ear and ear canal normal. A middle ear effusion is present.  Nose: Mucosal edema and rhinorrhea present. No nose lacerations, sinus tenderness, nasal deformity, septal deviation or nasal septal hematoma. No epistaxis.  No foreign bodies. Right sinus exhibits no maxillary sinus tenderness and no frontal sinus tenderness. Left sinus exhibits no maxillary sinus tenderness and no frontal sinus tenderness.  Mouth/Throat: Uvula is midline and mucous membranes are normal. Mucous membranes are  not pale, not dry and not cyanotic. She does not have dentures. No oral lesions. No trismus in the jaw. Normal dentition. No dental abscesses, uvula swelling, lacerations or dental caries. Posterior oropharyngeal edema and posterior oropharyngeal erythema present. No oropharyngeal exudate or tonsillar abscesses.  Cobblestoning posterior pharynx; cryptic tonsils 2+/4 bilaterally edema erythema; bilateral air fluid level TMs clear  Eyes: Conjunctivae, EOM and lids are normal. Pupils are equal, round, and reactive to light. Right eye exhibits no chemosis, no discharge, no exudate and no hordeolum. No foreign body present in the right eye. Left eye exhibits no chemosis, no discharge, no exudate and no hordeolum. No foreign body present in the left eye. Right conjunctiva is not injected. Right conjunctiva has no hemorrhage. Left conjunctiva is not injected. Left conjunctiva has no hemorrhage. No scleral icterus. Right eye exhibits normal extraocular motion and no nystagmus. Left eye exhibits normal extraocular motion and no nystagmus. Right pupil is round and reactive. Left pupil is round and reactive. Pupils are equal.  Neck: Trachea normal and normal range of motion. Neck supple. No tracheal tenderness, no spinous process tenderness and no muscular tenderness present. No rigidity.  No tracheal deviation, no edema, no erythema and normal range of motion present. No thyroid mass and no thyromegaly present.  Cardiovascular: Normal rate, regular rhythm, S1 normal, S2 normal, normal heart sounds and intact distal pulses.  PMI is not displaced.  Exam reveals no gallop and no friction rub.   No murmur heard. Pulmonary/Chest: Effort normal and breath sounds normal. No accessory muscle usage or stridor. No respiratory distress. She has no decreased breath sounds. She has no wheezes. She has no rhonchi. She has no rales. She exhibits no tenderness.  Abdominal: Soft. Bowel sounds are normal. She exhibits no shifting  dullness, no distension, no pulsatile liver, no fluid wave, no abdominal bruit, no ascites, no pulsatile midline mass and no mass. There is no hepatosplenomegaly. There is no tenderness. There is no rigidity, no rebound, no guarding, no CVA tenderness, no tenderness at McBurney's point and negative Murphy's sign. Hernia confirmed negative in the ventral area.  Dull to percussion x 4 quads  Musculoskeletal: Normal range of motion. She exhibits no edema or tenderness.  Lymphadenopathy:       Head (right side): No submental, no submandibular, no tonsillar, no preauricular, no posterior auricular and no occipital adenopathy present.       Head (left side): No submental, no submandibular, no tonsillar, no preauricular, no posterior auricular and no occipital adenopathy present.    She has no cervical adenopathy.       Right cervical: No superficial cervical, no deep cervical and no posterior cervical adenopathy present.      Left cervical: No superficial cervical, no deep cervical and no posterior cervical adenopathy present.  Neurological: She is alert and oriented to person, place, and time. She displays no atrophy and no tremor. No cranial nerve deficit or sensory deficit. She exhibits normal muscle tone. She displays no seizure activity. Coordination and gait normal. GCS eye subscore is 4. GCS verbal subscore is 5. GCS motor subscore is 6.  Skin: Skin is warm, dry and intact. No abrasion, no bruising, no burn, no ecchymosis, no laceration, no lesion, no petechiae and no rash noted. She is not diaphoretic. No cyanosis or erythema. No pallor. Nails show no clubbing.  Psychiatric: She has a normal mood and affect. Her speech is normal and behavior is normal. Judgment and thought content normal. Cognition and memory are normal.  Nursing note and vitals reviewed.   ED Course  Procedures (including critical care time)  Labs Review Labs Reviewed  RAPID STREP SCREEN (NOT AT [facility])  CULTURE, GROUP A STREP  ([facility] ONLY)    Imaging Review No results found.   MDM   1. Tonsillitis   2. Eustachian tube dysfunction, bilateral    Patient refused work excuse note given to patient for 24 hours.  Will stay home if throat culture positive later this week.  Wants to start penicillin tonight Discussed if viral illnes only she may break out in rash.  Given Rx for Dukes mouthwash also 6ml po TID gargle and swallow.  Motrin 800mg  po TID prn pain/fever.  Will call with throat culture results typically 48 hours once available 72 hours if slow growing.  Usually no specific medical treatment is needed if a virus is causing the sore throat.  The throat most often gets better on its own within 5 to 7 days.  Antibiotic medicine does not cure viral pharyngitis.   For acute pharyngitis caused by bacteria, your healthcare provider will prescribe an antibiotic.  Marland Kitchen Do not smoke.  Marland Kitchen  Avoid secondhand smoke and other air pollutants.  . Use a cool mist humidifier to add moisture to the air.  . Get plenty of rest.  . You may want to rest your throat by talking less and eating a diet that is mostly liquid or soft for a day or two.   Marland Kitchen Nonprescription throat lozenges and mouthwashes should help relieve the soreness.   . Gargling with warm saltwater and drinking warm liquids may help.  (You can make a saltwater solution by adding 1/4 teaspoon of salt to 8 ounces, or 240 mL, of warm water.)  . A nonprescription pain reliever such as aspirin, acetaminophen, or ibuprofen may ease general aches and pains.   FOLLOW UP with clinic provider if no improvements in the next 7-10 days.  Patient verbalized understanding of instructions and agreed with plan of care. P2:  Hand washing and diet.  Discussed with patient fluid in middle ear and tonsils probably obstructing eustachian tube causing fluid back up and pressure.  Motrin 800mg  po TID.  Consider flonase or nasacort 2 sprays each nostril daily for post nasal drip.  If worsening  symptoms, fever greater than 100.5 contact clinic will start patient on augmentin 875mg  po BID x 10 days otitis media.  Supportive treatment.   No evidence of invasive bacterial infection, non toxic and well hydrated.  This is most likely self limiting viral infection.  I do not see where any further testing or imaging is necessary at this time.   I will suggest supportive care, rest, good hygiene and encourage the patient to take adequate fluids.  The patient is to return to clinic or EMERGENCY ROOM if symptoms worsen or change significantly e.g. ear pain, fever, purulent discharge from ears or bleeding.  Exitcare handout on otitis media with effusion given to patient.  Patient verbalized agreement and understanding of treatment plan.    21 Nov 2014 at 1730 Attempted to reach patient via telephone voicemail not available.  Throat culture normal/negative.  Olen Cordial, NP 11/21/14 1738  22 Nov 2014 at [place] with patient via telephone notified throat culture normal/negative.  Patient reported ears still feeling full and sore throat.  Discussed with patient cobblestoning and middle ear effusion noted during PE 18 Oct continue flonase 1 spray each nostril BID, unable to tolerate sudafed palpitations, will trial benadryl/maalox gargle and swallow as Dukes out of stock at her pharmacy.  Has had to use prednisone in the past with Dr Posey Pronto per patient.  Holding at this time as interactions with xanax and wellbutrin xl.  Contact clinic if no improvement or worsening of symptoms.  Patient verbalized understanding of information/instructions, agreed with plan of care and had no further questions at this time.  Olen Cordial, NP 11/22/14 [phone]

## 2014-11-19 NOTE — ED Notes (Signed)
Sore throat on and off for 1 month.. Tested positive for Strep F here in September

## 2014-11-22 LAB — CULTURE, GROUP A STREP (THRC)

## 2014-11-23 ENCOUNTER — Other Ambulatory Visit: Payer: Self-pay | Admitting: Family Medicine

## 2014-11-23 DIAGNOSIS — Z1231 Encounter for screening mammogram for malignant neoplasm of breast: Secondary | ICD-10-CM

## 2014-11-28 ENCOUNTER — Ambulatory Visit
Admission: RE | Admit: 2014-11-28 | Discharge: 2014-11-28 | Disposition: A | Discharge status: Home / Self Care | Payer: Managed Care, Other (non HMO) | Source: Ambulatory Visit | Attending: Family Medicine | Admitting: Family Medicine

## 2014-11-28 ENCOUNTER — Other Ambulatory Visit: Payer: Self-pay | Admitting: Family Medicine

## 2014-11-28 DIAGNOSIS — Z1231 Encounter for screening mammogram for malignant neoplasm of breast: Secondary | ICD-10-CM | POA: Insufficient documentation

## 2014-12-20 ENCOUNTER — Ambulatory Visit: Payer: Medicare Other

## 2015-01-04 DIAGNOSIS — D3101 Benign neoplasm of right conjunctiva: Secondary | ICD-10-CM | POA: Diagnosis not present

## 2015-01-04 DIAGNOSIS — H01009 Unspecified blepharitis unspecified eye, unspecified eyelid: Secondary | ICD-10-CM | POA: Diagnosis not present

## 2015-01-04 DIAGNOSIS — H2513 Age-related nuclear cataract, bilateral: Secondary | ICD-10-CM | POA: Diagnosis not present

## 2015-01-04 DIAGNOSIS — H18413 Arcus senilis, bilateral: Secondary | ICD-10-CM | POA: Diagnosis not present

## 2015-01-14 DIAGNOSIS — R5383 Other fatigue: Secondary | ICD-10-CM | POA: Diagnosis not present

## 2015-01-14 DIAGNOSIS — R591 Generalized enlarged lymph nodes: Secondary | ICD-10-CM | POA: Diagnosis not present

## 2015-01-14 DIAGNOSIS — R21 Rash and other nonspecific skin eruption: Secondary | ICD-10-CM | POA: Diagnosis not present

## 2015-01-14 DIAGNOSIS — E663 Overweight: Secondary | ICD-10-CM | POA: Diagnosis not present

## 2015-01-14 DIAGNOSIS — R7309 Other abnormal glucose: Secondary | ICD-10-CM | POA: Diagnosis not present

## 2015-01-22 DIAGNOSIS — L7211 Pilar cyst: Secondary | ICD-10-CM | POA: Diagnosis not present

## 2015-01-22 DIAGNOSIS — D485 Neoplasm of uncertain behavior of skin: Secondary | ICD-10-CM | POA: Diagnosis not present

## 2015-02-01 DIAGNOSIS — F410 Panic disorder [episodic paroxysmal anxiety] without agoraphobia: Secondary | ICD-10-CM | POA: Diagnosis not present

## 2015-05-30 ENCOUNTER — Encounter: Payer: Self-pay | Admitting: Emergency Medicine

## 2015-05-30 ENCOUNTER — Emergency Department
Admission: EM | Admit: 2015-05-30 | Discharge: 2015-05-30 | Disposition: A | Discharge status: Home / Self Care | Payer: 59 | Attending: Emergency Medicine | Admitting: Emergency Medicine

## 2015-05-30 ENCOUNTER — Emergency Department: Payer: 59

## 2015-05-30 DIAGNOSIS — Z87891 Personal history of nicotine dependence: Secondary | ICD-10-CM | POA: Insufficient documentation

## 2015-05-30 DIAGNOSIS — R002 Palpitations: Secondary | ICD-10-CM | POA: Insufficient documentation

## 2015-05-30 DIAGNOSIS — Z79899 Other long term (current) drug therapy: Secondary | ICD-10-CM | POA: Insufficient documentation

## 2015-05-30 DIAGNOSIS — Z791 Long term (current) use of non-steroidal anti-inflammatories (NSAID): Secondary | ICD-10-CM | POA: Diagnosis not present

## 2015-05-30 DIAGNOSIS — R000 Tachycardia, unspecified: Secondary | ICD-10-CM | POA: Diagnosis present

## 2015-05-30 HISTORY — DX: Tachycardia, unspecified: R00.0

## 2015-05-30 LAB — CBC
HEMATOCRIT: 38 % (ref 35.0–47.0)
Hemoglobin: 13.2 g/dL (ref 12.0–16.0)
MCH: 29 pg (ref 26.0–34.0)
MCHC: 34.7 g/dL (ref 32.0–36.0)
MCV: 83.5 fL (ref 80.0–100.0)
PLATELETS: 237 10*3/uL (ref 150–440)
RBC: 4.55 MIL/uL (ref 3.80–5.20)
RDW: 13.1 % (ref 11.5–14.5)
WBC: 7.2 10*3/uL (ref 3.6–11.0)

## 2015-05-30 LAB — BASIC METABOLIC PANEL
Anion gap: 11 (ref 5–15)
BUN: 10 mg/dL (ref 6–20)
CHLORIDE: 106 mmol/L (ref 101–111)
CO2: 22 mmol/L (ref 22–32)
Calcium: 9.1 mg/dL (ref 8.9–10.3)
Creatinine, Ser: 0.68 mg/dL (ref 0.44–1.00)
Glucose, Bld: 140 mg/dL — ABNORMAL HIGH (ref 65–99)
POTASSIUM: 3.1 mmol/L — AB (ref 3.5–5.1)
SODIUM: 139 mmol/L (ref 135–145)

## 2015-05-30 LAB — TROPONIN I: Troponin I: <0.03 ng/mL (ref <0.031)

## 2015-05-30 MED ORDER — POTASSIUM CHLORIDE 20 MEQ PO PACK
40.0000 meq | PACK | Freq: Two times a day (BID) | ORAL | Status: DC
  Administered 2015-05-30: 40 meq via ORAL
  Filled 2015-05-30: qty 2

## 2015-05-30 MED ORDER — LORAZEPAM 2 MG/ML IJ SOLN
1.0000 mg | Freq: Once | INTRAMUSCULAR | Status: AC
  Administered 2015-05-30: 1 mg via INTRAVENOUS
  Filled 2015-05-30: qty 1

## 2015-05-30 NOTE — ED Notes (Signed)
Pt resting in bed, resp even and unlabored, husband at bedside

## 2015-05-30 NOTE — ED Provider Notes (Signed)
[facility] Emergency Department Provider Note  ____________________________________________  Time seen: 3:00 AM  I have reviewed the triage vital signs and the nursing notes.   HISTORY  Chief Complaint Tachycardia      HPI Ariel Bryant is a 45 y.o. female with history of anxiety, PTSD, and sinus tachycardia evaluated by Dr. Saralyn Pilar  in the past and prescribed metoprolol presents via EMS with awakening to rapid heart rate this morning. Per EMS patient's heart rate ranged from 70s to 1:15. EKG performed by EMS reveals normal sinus rhythm. Patient denies any chest pain no shortness of breath however does admit to feeling anxious. Patient heart rate on arrival to the emergency department 86. Patient states that she was recently prescribed citalopram for anxiety but did not "like the way it made her feel after her initial dose and a such did not take any dose yesterday. In addition the patient states that she takes alprazolam for anxiety but did not take her dose last night.    Past Medical History  Diagnosis Date  . Anxiety   . PTSD (post-traumatic stress disorder)   . GERD (gastroesophageal reflux disease)   . Sinus tachycardia ([place])     There are no active problems to display for this patient.   Past Surgical History  Procedure Laterality Date  . Cesarean section      NSD x 3 followed by C/S for 8 + pounds breech  . Laparoscopic oopherectomy    . Cholecystectomy    . Vaginal hysterectomy      had BTL, then VH, later developed ovarian mass and had ooph at 45 yo    Current Outpatient Rx  Name  Route  Sig  Dispense  Refill  . acyclovir (ZOVIRAX) 400 MG tablet   Oral   Take 400 mg by mouth as needed.         . ALPRAZolam (XANAX) 1 MG tablet   Oral   Take 1 mg by mouth at bedtime as needed for anxiety.         . Ascorbic Acid (VITAMIN C) 1000 MG tablet   Oral   Take 1,000 mg by mouth daily.         . Biotin 1 MG CAPS   Oral    Take 1 capsule by mouth daily.         Marland Kitchen buPROPion (WELLBUTRIN XL) 300 MG 24 hr tablet   Oral   Take 300 mg by mouth daily.         . Calcium Carb-Cholecalciferol (CALCIUM + D3) 600-200 MG-UNIT TABS   Oral   Take 1 tablet by mouth daily.         . Diphenhyd-Hydrocort-Nystatin (FIRST-DUKES MOUTHWASH) SUSP   Mouth/Throat   Use as directed 20 mLs in the mouth or throat 3 (three) times daily as needed.   237 mL   0   . ibuprofen (ADVIL,MOTRIN) 800 MG tablet   Oral   Take 1 tablet (800 mg total) by mouth every 8 (eight) hours as needed.   30 tablet   1   . ibuprofen (ADVIL,MOTRIN) 800 MG tablet   Oral   Take 1 tablet (800 mg total) by mouth 3 (three) times daily.   30 tablet   0   . metoprolol succinate (TOPROL-XL) 25 MG 24 hr tablet   Oral   Take 12.5 mg by mouth as needed.         Marland Kitchen omega-3 acid ethyl esters (LOVAZA) 1  G capsule   Oral   Take 1 g by mouth 2 (two) times daily.         Marland Kitchen omeprazole (PRILOSEC) 40 MG capsule   Oral   Take 40 mg by mouth daily.         . penicillin v potassium (VEETID) 500 MG tablet   Oral   Take 1 tablet (500 mg total) by mouth 2 (two) times daily.   20 tablet   0   . Prenatal Vit-Fe Fumarate-FA (PRENATAL VITAMIN PO)   Oral   Take 1 tablet by mouth daily.           Allergies Codeine; Sulfa antibiotics; and Doxycycline  Family History  Problem Relation Age of Onset  . Hypertension Father   . Breast cancer Maternal Aunt 30  . Breast cancer Paternal Aunt   . Breast cancer Paternal Aunt     Social History Social History  Substance Use Topics  . Smoking status: Former Research scientist (life sciences)  . Smokeless tobacco: None  . Alcohol Use: No    Review of Systems  Constitutional: Negative for fever. Eyes: Negative for visual changes. ENT: Negative for sore throat. Cardiovascular: Negative for chest pain. Respiratory: Negative for shortness of breath. Gastrointestinal: Negative for abdominal pain, vomiting and  diarrhea. Genitourinary: Negative for dysuria. Musculoskeletal: Negative for back pain. Skin: Negative for rash. Neurological: Negative for headaches, focal weakness or numbness. Psychiatric: Positive for anxiety  10-point ROS otherwise negative.  ____________________________________________   PHYSICAL EXAM:  VITAL SIGNS: ED Triage Vitals  Enc Vitals Group     BP 05/30/15 0302 116/78 mmHg     Pulse Rate 05/30/15 0302 86     Resp 05/30/15 0302 16     Temp 05/30/15 0302 98.1 F (36.7 C)     Temp Source 05/30/15 0302 Oral     SpO2 05/30/15 0302 100 %     Weight 05/30/15 0302 160 lb (72.576 kg)     Height 05/30/15 0302 5\' 5"  (1.651 m)     Head Cir --      Peak Flow --      Pain Score --      Pain Loc --      Pain Edu? --      Excl. in [place]? --      Constitutional: Alert and oriented. Appears anxious Eyes: Conjunctivae are normal. PERRL. Normal extraocular movements. ENT   Head: Normocephalic and atraumatic.   Nose: No congestion/rhinnorhea.   Mouth/Throat: Mucous membranes are moist.   Neck: No stridor. Hematological/Lymphatic/Immunilogical: No cervical lymphadenopathy. Cardiovascular: Normal rate, regular rhythm. Normal and symmetric distal pulses are present in all extremities. No murmurs, rubs, or gallops. Respiratory: Normal respiratory effort without tachypnea nor retractions. Breath sounds are clear and equal bilaterally. No wheezes/rales/rhonchi. Gastrointestinal: Soft and nontender. No distention. There is no CVA tenderness. Genitourinary: deferred Musculoskeletal: Nontender with normal range of motion in all extremities. No joint effusions.  No lower extremity tenderness nor edema. Neurologic:  Normal speech and language. No gross focal neurologic deficits are appreciated. Speech is normal.  Skin:  Skin is warm, dry and intact. No rash noted. Psychiatric: Mood and affect are normal. Speech and behavior are normal. Patient exhibits appropriate insight  and judgment.  ____________________________________________    LABS (pertinent positives/negatives)  Labs Reviewed  BASIC METABOLIC PANEL - Abnormal; Notable for the following:    Potassium 3.1 (*)    Glucose, Bld 140 (*)    All other components within normal limits  CBC  TROPONIN I  TROPONIN I     ____________________________________________   EKG  ED ECG REPORT I, Marmaduke N BROWN, the attending physician, personally viewed and interpreted this ECG.   Date: 05/30/2015  EKG Time: 3:01 AM  Rate: 80  Rhythm: Normal sinus rhythm  Axis: Normal  Intervals: Normal  ST&T Change: None    DG Chest Port 1 View (Final result) Result time: 05/30/15 03:35:55   Final result by Rad Results In Interface (05/30/15 03:35:55)   Narrative:   CLINICAL DATA: Palpitations  EXAM: PORTABLE CHEST 1 VIEW  COMPARISON: 07/09/2011  FINDINGS: Normal heart size and mediastinal contours. No acute infiltrate or edema. No effusion or pneumothorax. No osseous findings.  IMPRESSION: Negative chest.   Electronically Signed By: Monte Fantasia M.D. On: 05/30/2015 03:35         INITIAL IMPRESSION / ASSESSMENT AND PLAN / ED COURSE  Pertinent labs & imaging results that were available during my care of the patient were reviewed by me and considered in my medical decision making (see chart for details).  Patient referred to Dr. Tomasita Crumble shows a further outpatient evaluation for heart palpitations. No arrhythmia noted in the emergency department  ____________________________________________   FINAL CLINICAL IMPRESSION(S) / ED DIAGNOSES  Final diagnoses:  Heart palpitations      Gregor Hams, MD 05/31/15 [phone]

## 2015-05-30 NOTE — Discharge Instructions (Signed)

## 2015-05-30 NOTE — ED Notes (Signed)
Pt present to ED via [facility] from home with c/o "feeling heart race." Reports woke up from sleep with heart racing, syncopal episode, diaphoresis, and weakness. Per EMS pt heart rate ranging 70s-115. Pt reports was diagnosed with sinus tachycardia by cardiologist. Pt placed on metoprolol as needed when heart is racing. Pt reports had an episode of abdominal pressure when she woke up but is now relieved. Pt alert and oriented x 4, no increased work in breathing noted, skin warm and dry.

## 2015-05-30 NOTE — ED Notes (Signed)
Xray tech in room to perform portable chest xray, pt tolerated procedure well.

## 2015-05-31 DIAGNOSIS — I10 Essential (primary) hypertension: Secondary | ICD-10-CM | POA: Diagnosis not present

## 2015-05-31 DIAGNOSIS — R002 Palpitations: Secondary | ICD-10-CM | POA: Diagnosis not present

## 2015-05-31 DIAGNOSIS — I493 Ventricular premature depolarization: Secondary | ICD-10-CM | POA: Diagnosis not present

## 2015-05-31 DIAGNOSIS — R000 Tachycardia, unspecified: Secondary | ICD-10-CM | POA: Diagnosis not present

## 2015-06-07 DIAGNOSIS — R002 Palpitations: Secondary | ICD-10-CM | POA: Diagnosis not present

## 2015-06-07 DIAGNOSIS — E876 Hypokalemia: Secondary | ICD-10-CM | POA: Diagnosis not present

## 2015-06-10 DIAGNOSIS — I493 Ventricular premature depolarization: Secondary | ICD-10-CM | POA: Diagnosis not present

## 2015-06-12 DIAGNOSIS — H524 Presbyopia: Secondary | ICD-10-CM | POA: Diagnosis not present

## 2015-06-12 DIAGNOSIS — H5213 Myopia, bilateral: Secondary | ICD-10-CM | POA: Diagnosis not present

## 2015-06-13 ENCOUNTER — Ambulatory Visit (INDEPENDENT_AMBULATORY_CARE_PROVIDER_SITE_OTHER): Payer: 59 | Admitting: Cardiology

## 2015-06-13 ENCOUNTER — Encounter: Payer: Self-pay | Admitting: Cardiology

## 2015-06-13 ENCOUNTER — Encounter: Payer: Self-pay | Admitting: *Deleted

## 2015-06-13 VITALS — BP 100/64 | HR 68 | Ht 65.0 in | Wt 161.4 lb

## 2015-06-13 DIAGNOSIS — R002 Palpitations: Secondary | ICD-10-CM

## 2015-06-13 NOTE — Progress Notes (Signed)
Cardiology Office Note   Date:  06/13/2015   ID:  Ariel Bryant, DOB [birth year], MRN [id number]  Referring Doctor:  Baltazar Apo, MD   Cardiologist:   Wende Bushy, MD   Reason for consultation:  Chief Complaint  Patient presents with  . New Patient (Initial Visit)    NO CP, SOB OR SWELLING.      History of Present Illness: Ariel Bryant is a 45 y.o. female who presents for episode of palpitations   she reports that she's had episodes for many years now. She had one episode in 2013. She woke up from sleep due to rapid heart rate. She felt her heart just racing away associated with pain in the epigastric area, nausea, diaphoresis. She was in bed at that time. Husband was also there. She was noted to lose consciousness for less than a minute or so. When she came to, patient recalls husband telling her to stay with him. EMS was called and was sent to the ER/hospital. They were not able to get a diagnosis  At the time. But she followed up with a cardiologist.  Previously seen Dr. Saralyn Pilar for palpitations. Found to have symptomatic PVCs, PACs, hypertension. Previous loop monitor 3/14 revealed occasional PVCs and PACs. Advice metoprolol tartrate 12.5-25 mg daily when necessary. However, with this medication, blood pressure dropped and resting heart rate also dropped. Patient cannot continue taking the medication daily and therefore was discontinued.   Patient had another episode approximately 2 weeks ago. Similar thing of waking up with her heart pounding and racing away. Does not also noted her to lose consciousness again for less than a minute. EMS was called and patient was sent to the ER.  05/30/2015 ER visit for palpitations. Per medical records, patient arrived in ER with EKG showing sinus rhythm 86 BPM. Potassium is noted be slightly low.troponins 2 were negative.  Patient was advised follow-up with cardiologist.  Ariel Cowman, MD - 06/10/2015 4:15 PM EDT 48 hour  Holter monitor was performed which revealed predominant normal sinus rhythm with a mean heart rate of 75 bpm. This tachycardia was observed. Occasional premature ventricular contractions were present. Rare premature atrial contractions were observed. There were no diary entries.   She patient reports that her symptoms are becoming more frequent, but not quite on a daily basis. Whenever she gets palpitations, this will be associated with tiredness and weakness. Apart from the episodes of palpitations, she has no chest pain or shortness of breath. Patient also reports that her palpitations have gotten worse in intensity over time. Symptoms are mainly in the chest.   No headache, fever, cough, colds. No orthopnea, PND, edema.    ROS:  Please see the history of present illness. Aside from mentioned under HPI, all other systems are reviewed and negative.     Past Medical History  Diagnosis Date  . Anxiety   . PTSD (post-traumatic stress disorder)   . GERD (gastroesophageal reflux disease)   . Sinus tachycardia [facility])     Past Surgical History  Procedure Laterality Date  . Cesarean section      NSD x 3 followed by C/S for 8 + pounds breech  . Laparoscopic oopherectomy    . Cholecystectomy    . Vaginal hysterectomy      had BTL, then VH, later developed ovarian mass and had ooph at 45 yo     reports that she has quit smoking. She does not have any smokeless tobacco history on file.  She reports that she does not drink alcohol.   family history includes Breast cancer in her paternal aunt and paternal aunt; Breast cancer (age of onset: 78) in her maternal aunt; Hypertension in her father.   Patient says her mother has had  2 cardioversions for very high heart rates.  Patient Unclear of diagnosis.  Current Outpatient Prescriptions  Medication Sig Dispense Refill  . acetaminophen (TYLENOL) 325 MG tablet Take 650 mg by mouth as needed for mild pain, moderate pain, fever or headache.     Marland Kitchen  acyclovir (ZOVIRAX) 400 MG tablet Take 400 mg by mouth as needed (AS NEEDED FOR COLD SORES).     Marland Kitchen ALPRAZolam (XANAX) 1 MG tablet Take 1 mg by mouth 4 (four) times daily as needed for anxiety.    . Ascorbic Acid (VITAMIN C) 1000 MG tablet Take 1,000 mg by mouth daily.    . Biotin 1 MG CAPS Take 1 capsule by mouth daily.    . Calcium Carb-Cholecalciferol (CALCIUM + D3) 600-200 MG-UNIT TABS Take 1 tablet by mouth 2 (two) times daily.     . cyclobenzaprine (FLEXERIL) 10 MG tablet Take 1 tablet by mouth at bedtime as needed for muscle spasms. Reported on 05/30/2015    . fluticasone (FLONASE) 50 MCG/ACT nasal spray Place 1 spray into both nostrils daily. For Eustachian Tube Dysfunction    . ibuprofen (ADVIL,MOTRIN) 200 MG tablet Take 800 mg by mouth as needed for fever, headache, mild pain, moderate pain or cramping.     . Magnesium 500 MG TABS Take 1 tablet by mouth daily.    . mometasone (ASMANEX) 220 MCG/INH inhaler Inhale 2 puffs into the lungs daily.    Marland Kitchen omeprazole (PRILOSEC) 40 MG capsule Take 40 mg by mouth daily.    . potassium gluconate 595 (99 K) MG TABS tablet Take 595 mg by mouth 3 (three) times a week.    . valACYclovir (VALTREX) 1000 MG tablet Take 2 tablets by mouth every 12 (twelve) hours as needed (AS NEEDED FOR COLD SORES OR SHINGLES OUTBREAK.).   6  . vitamin B-12 (CYANOCOBALAMIN) 1000 MCG tablet Take 1,000 mcg by mouth daily.    . metoprolol tartrate (LOPRESSOR) 25 MG tablet Take 12.5 mg by mouth as needed (For Tachycardia lasting more than 15 minutes). Reported on 06/13/2015     No current facility-administered medications for this visit.    Allergies: Codeine; Sulfa antibiotics; Azithromycin; Ciprofloxacin; Doxycycline monohydrate; Erythromycin; and Doxycycline    PHYSICAL EXAM: VS:  BP 100/64 mmHg  Pulse 68  Ht 5\' 5"  (1.651 m)  Wt 161 lb 6.4 oz (73.211 kg)  BMI 26.86 kg/m2 , Body mass index is 26.86 kg/(m^2). Wt Readings from Last 3 Encounters:  06/13/15 161 lb 6.4 oz  (73.211 kg)  05/30/15 160 lb (72.576 kg)  11/19/14 160 lb (72.576 kg)    GENERAL:  well developed, well nourished, not in acute distress HEENT: normocephalic, pink conjunctivae, anicteric sclerae, no xanthelasma, normal dentition, oropharynx clear NECK:  no neck vein engorgement, JVP normal, no hepatojugular reflux, carotid upstroke brisk and symmetric, no bruit, no thyromegaly, no lymphadenopathy LUNGS:  good respiratory effort, clear to auscultation bilaterally CV:  PMI not displaced, no thrills, no lifts, S1 and S2 within normal limits, no palpable S3 or S4, no murmurs, no rubs, no gallops ABD:  Soft, nontender, nondistended, normoactive bowel sounds, no abdominal aortic bruit, no hepatomegaly, no splenomegaly MS: nontender back, no kyphosis, no scoliosis, no joint deformities EXT:  2+ DP/PT pulses,  no edema, no varicosities, no cyanosis, no clubbing SKIN: warm, nondiaphoretic, normal turgor, no ulcers NEUROPSYCH: alert, oriented to person, place, and time, sensory/motor grossly intact, normal mood, appropriate affect  Recent Labs: 05/30/2015: BUN 10; Creatinine, Ser 0.68; Hemoglobin 13.2; Platelets 237; Potassium 3.1*; Sodium 139   Lipid Panel No results found for: CHOL, TRIG, HDL, CHOLHDL, VLDL, LDLCALC, LDLDIRECT   Other studies Reviewed:  EKG:  The ekg from 05/30/2015 from the ER was personally reviewed by me and it revealed sinus rhythm 80 BPM.  Additional studies/ records that were reviewed personally reviewed by me today include: none available     ASSESSMENT AND PLAN: Palpitations PACs, PVCs  her episodes are possibly related to SVT. However previous workup has not revealed any specific diagnosis. Recommend to do event monitor. Check potassium, check free T4 and TSH. Recommend echocardiogram.  Recommend to obtain previous EKGs,  Specifically that of 08/23/2011  --- this was requested through medical records. I am informed that they are not able to pull up any EKG of this  patient dated 08/23/2011 23:27.   Current medicines are reviewed at length with the patient today.  The patient does not have concerns regarding medicines.  Labs/ tests ordered today include:  Orders Placed This Encounter  Procedures  . Basic Metabolic Panel (BMET)  . TSH  . T4, free  . Cardiac event monitor  . Echocardiogram    I had a lengthy and detailed discussion with the patient regarding diagnoses, prognosis, diagnostic options, treatment options , and side effects of medications.   I counseled the patient on importance of lifestyle modification including heart healthy diet, regular physical activity .   Disposition:   FU with undersigned after tests   I spent at least 60 minutes with the patient today and more than 50% of the time was spent counseling the patient and coordinating care.       Signed, Wende Bushy, MD  06/13/2015 10:49 AM    [place]

## 2015-06-13 NOTE — Patient Instructions (Addendum)
Medication Instructions:  Your physician recommends that you continue on your current medications as directed. Please refer to the Current Medication list given to you today.   Labwork: Labs done today are: BMP, TSH, T4  Testing/Procedures: Your physician has requested that you have an echocardiogram. Echocardiography is a painless test that uses sound waves to create images of your heart. It provides your doctor with information about the size and shape of your heart and how well your heart's chambers and valves are working. This procedure takes approximately one hour. There are no restrictions for this procedure.  Date & Time: __________________________________________________________________  Your physician has recommended that you wear an event monitor. Event monitors are medical devices that record the heart's electrical activity. Doctors most often [place] these monitors to diagnose arrhythmias. Arrhythmias are problems with the speed or rhythm of the heartbeat. The monitor is a small, portable device. You can wear one while you do your normal daily activities. This is usually used to diagnose what is causing palpitations/syncope (passing out).  30 Day event monitor will be mailed to your home.  Follow-Up: Your physician recommends that you schedule a follow-up appointment after testing with Dr. Yvone Neu.  Date & Time:___________________________________________________________________   Any Other Special Instructions Will Be Listed Below (If Applicable).     If you need a refill on your cardiac medications before your next appointment, please call your pharmacy.  Cardiac Event Monitoring A cardiac event monitor is a small recording device used to help detect abnormal heart rhythms (arrhythmias). The monitor is used to record heart rhythm when noticeable symptoms such as the following occur:  Fast heartbeats (palpitations), such as heart racing or fluttering.  Dizziness.  Fainting or  light-headedness.  Unexplained weakness. The monitor is wired to two electrodes placed on your chest. Electrodes are flat, sticky disks that attach to your skin. The monitor can be worn for up to 30 days. You will wear the monitor at all times, except when bathing.  HOW TO USE YOUR CARDIAC EVENT MONITOR A technician will prepare your chest for the electrode placement. The technician will show you how to place the electrodes, how to work the monitor, and how to replace the batteries. Take time to practice using the monitor before you leave the office. Make sure you understand how to send the information from the monitor to your health care provider. This requires a telephone with a landline, not a cell phone. You need to:  Wear your monitor at all times, except when you are in water:  Do not get the monitor wet.  Take the monitor off when bathing. Do not swim or use a hot tub with it on.  Keep your skin clean. Do not put body lotion or moisturizer on your chest.  Change the electrodes daily or any time they stop sticking to your skin. You might need to use tape to keep them on.  It is possible that your skin under the electrodes could become irritated. To keep this from happening, try to put the electrodes in slightly different places on your chest. However, they must remain in the area under your left breast and in the upper right section of your chest.  Make sure the monitor is safely clipped to your clothing or in a location close to your body that your health care provider recommends.  Press the button to record when you feel symptoms of heart trouble, such as dizziness, weakness, light-headedness, palpitations, thumping, shortness of breath, unexplained weakness, or a  fluttering or racing heart. The monitor is always on and records what happened slightly before you pressed the button, so do not worry about being too late to get good information.  Keep a diary of your activities, such as  walking, doing chores, and taking medicine. It is especially important to note what you were doing when you pushed the button to record your symptoms. This will help your health care provider determine what might be contributing to your symptoms. The information stored in your monitor will be reviewed by your health care provider alongside your diary entries.  Send the recorded information as recommended by your health care provider. It is important to understand that it will take some time for your health care provider to process the results.  Change the batteries as recommended by your health care provider. SEEK IMMEDIATE MEDICAL CARE IF:   You have chest pain.  You have extreme difficulty breathing or shortness of breath.  You develop a very fast heartbeat that persists.  You develop dizziness that does not go away.  You faint or constantly feel you are about to faint.   This information is not intended to replace advice given to you by your health care provider. Make sure you discuss any questions you have with your health care provider.   Document Released: 10/29/2007 Document Revised: 02/09/2014 Document Reviewed: 07/18/2012 Elsevier Interactive Patient Education [employer].   Echocardiogram An echocardiogram, or echocardiography, uses sound waves (ultrasound) to produce an image of your heart. The echocardiogram is simple, painless, obtained within a short period of time, and offers valuable information to your health care provider. The images from an echocardiogram can provide information such as:  Evidence of coronary artery disease (CAD).  Heart size.  Heart muscle function.  Heart valve function.  Aneurysm detection.  Evidence of a past heart attack.  Fluid buildup around the heart.  Heart muscle thickening.  Assess heart valve function. LET [facility] CARE PROVIDER KNOW ABOUT:  Any allergies you have.  All medicines you are taking, including  vitamins, herbs, eye drops, creams, and over-the-counter medicines.  Previous problems you or members of your family have had with the use of anesthetics.  Any blood disorders you have.  Previous surgeries you have had.  Medical conditions you have.  Possibility of pregnancy, if this applies. BEFORE THE PROCEDURE  No special preparation is needed. Eat and drink normally.  PROCEDURE   In order to produce an image of your heart, gel will be applied to your chest and a wand-like tool (transducer) will be moved over your chest. The gel will help transmit the sound waves from the transducer. The sound waves will harmlessly bounce off your heart to allow the heart images to be captured in real-time motion. These images will then be recorded.  You may need an IV to receive a medicine that improves the quality of the pictures. AFTER THE PROCEDURE You may return to your normal schedule including diet, activities, and medicines, unless your health care provider tells you otherwise.   This information is not intended to replace advice given to you by your health care provider. Make sure you discuss any questions you have with your health care provider.   Document Released: 01/17/2000 Document Revised: 02/09/2014 Document Reviewed: 09/26/2012 Elsevier Interactive Patient Education 2016 [place] Metabolic Panel WHY AM I HAVING THIS TEST? A basic metabolic panel measures levels of the following substances in your blood:   Glucose. Glucose is  a simple sugar that serves as the main source of energy for your body.  Creatinine. Creatinine is a waste product of normal muscle activity. It is excreted from the body by the kidneys.  Blood urea nitrogen (BUN). Urea nitrogen is a waste product of protein breakdown. It is produced when excess protein in your body is broken down and used for energy. It is excreted by the kidneys.  Electrolytes. Electrolytes are negatively or positively  charged particles that are dissolved in the water of different body compartments. This includes the serum portion of blood, water inside cells, and water outside cells. Concentrations of electrolytes vary among the different fluid compartments. Electrolytes are tightly regulated to maintain a salt-water and acid-base balance in the body. The electrolytes measured in a basic metabolic panel include:  Potassium.  Sodium.  Chloride.  Calcium.  Bicarbonate. WHAT KIND OF SAMPLE IS TAKEN? A blood sample is required for this test. It is usually collected by inserting a needle into a vein. HOW DO I PREPARE FOR THE TEST? Your health care provider may ask you to avoid eating or drinking anything before your blood sample is taken. Do not eat or drink anything after midnight on the night before the procedure or as directed by your health care provider. WHAT ARE THE REFERENCE RANGES? Reference ranges are considered healthy ranges established after testing a large group of healthy people. Reference ranges may vary among different people, labs, and hospitals. It is your responsibility to obtain your test results. Ask the lab or department performing the test when and how you will get your results. The following are reference ranges for each part of a basic metabolic panel: Glucose  Cord: 45-96 mg/dL or 2.5-5.3 mmol/L (SI units).  Premature infant: 20-60 mg/dL or 1.1-3.3 mmol/L.  Neonate: 30-60 mg/dL or 1.7-3.3 mmol/L.  Infant: 40-90 mg/dL or 2.2-5 mmol/L.  Child under 89 years old: 60-100 mg/dL or 3.3-5.5 mmol/L.  Adult or child over 35 years old:  Fasting: 70-110 mg/dL or less than 6.1 mmol/L.  Random (nonfasting or casual): less than or equal to 200 mg/dL or less than 11.1 mmol/L.  Elderly: increase in normal range after age 64 years. Creatinine  Child under 49 years old: 0.1-0.4 mg/dL.  Child 108-31 years old: 0.2-0.5 mg/dL.  Child 84-20 years old: 0.3-0.6 mg/dL.  Child or adolescent 76-44  years old: 0.4-1 mg/dL.  Adult 97-78 years old:  Female: 0.5-1 mg/dL.  Female: 0.6-1.2 mg/dL.  Adult 33-13 years old:  Female: 0.5-1.1 mg/dL.  Female: 0.6-1.3 mg/dL.  Adult 26 years old and above:  Female: 0.5-1.2 mg/dL.  Female: 0.7-1.3 mg/dL. BUN  Cord: 21-40 mg/dL.  Newborn: 3-12 mg/dL.  Infant: 5-18 mg/dL.  Child: 5-18 mg/dL.  Adult: 10-20 mg/dL or 3.6-7.1 mmol/L (SI units).  Elderly: may be slightly higher than adult. Potassium  Newborn: 3.9-5.9 mEq/L.  Infant: 4.1-5.3 mEq/L.  Child: 3.4-4.7 mEq/L.  Adult or elderly: 3.5-5 mEq/L or 3.5-5 mmol/L (SI units). Sodium  Newborn: 134-144 mEq/L.  Infant: 134-150 mEq/L.  Child: 136-145 mEq/L.  Adult or elderly: 136-145 mEq/L or 136-145 mmol/L (SI units). Chloride  Premature infant: 95-110 mEq/L.  Newborn: 96-106 mEq/L.  Child: 90-110 mEq/L.  Adult or elderly: 98-106 mEq/L or 98-106 mmol/L (SI units). Calcium  Total calcium:  Newborn under 67 days old: 7.6-10.4 mg/dL or 1.9-2.6 mmol/L.  Umbilical: [id number] mg/dL or 2.25-2.88 mmol/L.  108 days to 46 years old: 9-10.6 mg/dL or 2.30-2.65 mmol/L.  Child: 8.8-10.8 mg/dL or 2.2-2.7 mmol/L.  Adult: 9-10.5 mg/dL  or 2.25-2.62 mmol/L.  Ionized calcium:  Newborn: 4.20-5.58 mg/dL or 1.05-1.37 mmol/L.  2 months to 46 years old: 4.80-5.52 mg/dL or 1.20-1.38 mmol/L.  Adult: 4.5-5.6 mg/dL or 1.05-1.30 mmol/L. Bicarbonate  Newborn: 13-22 mEq/L.  Infant: 20-28 mEq/L.  Child: 20-28 mEq/L.  Adult or elderly: 23-30 mEq/L or 23-30 mmol/L (SI units). WHAT DO THE RESULTS MEAN? Diet and levels of activity can have an effect on your test results. Sometimes they can be the cause of values that are outside of normal limits. However, sometimes values outside normal limits can indicate a medical disorder.  Glucose:  Abnormally high glucose levels (hyperglycemia) are usually associated with prediabetes mellitus and diabetes mellitus. They can also occur with severe  stress on the body. This can come from surgery or events such as stroke or trauma. Overactive thyroid gland and pancreatitis or pancreatic cancer can also cause abnormally high glucose levels.  Abnormally low glucose levels (hypoglycemia) can occur with underactive thyroid gland and rare insulin-secreting tumors (insulinoma).  Creatinine:  Abnormally high creatinine levels are most commonly seen in kidney failure. They can also be seen with overactive thyroid (hyperthyroidism), conditions related to overgrowth of the body, abnormal breakdown of muscle tissue, and early muscular dystrophy.  Abnormally low creatinine levels can indicate low muscle mass associated with malnutrition or late-stage muscular dystrophy.  BUN:  Abnormally high BUN levels generally mean that your kidneys are not functioning normally.  Abnormally low BUN levels can be seen with malnutrition and liver failure.  Potassium:  Abnormally high potassium levels are most often seen with kidney disease, massive destruction of red blood cells, and adrenal gland failure.  Abnormally low potassium levels are seen with excessive levels of the hormone aldosterone.  Sodium:  Abnormally high sodium levels can be seen with dehydration, excessive thirst, and urination due to abnormally low levels of antidiuretic hormone (diabetes insipidus). They can also be seen with excessive levels of aldosterone or cortisol in the body.  Abnormally low levels of sodium can be seen with congestive heart failure, cirrhosis of the liver, kidney failure, and the syndrome of inappropriate antidiuretic hormone (SIADH). Chloride:  Abnormally high levels of chloride can be seen with acute kidney failure, diabetes insipidus, prolonged diarrhea, and poisoning with aspirin or bromide.  Abnormally low levels of chloride can be seen with prolonged vomiting, acute adrenal gland failure, and SIADH.  Calcium:  Abnormally high levels of calcium can occur  with excessive activity of the parathyroid glands, certain cancers, and a type of inflammation seen in sarcoidosis and tuberculosis.  Abnormally low levels of calcium can be seen with underactive parathyroid glands, vitamin D deficiency, and acute pancreatitis.  Bicarbonate:  Abnormally high bicarbonate levels are seen after prolonged vomiting and diuretic therapy, which lead to a decrease in the amount of acid in the body (metabolic alkalosis). They can also be seen in conditions that increase the amount of bicarbonate in the body. These conditions include rare hereditary disorders that interfere with how your kidneys handle electrolytes.  Abnormally low bicarbonate levels are seen with conditions that cause your body to produce too much acid (metabolic acidosis). These conditions include uncontrolled diabetes mellitus and poisoning with aspirin, methanol, or antifreeze (ethylene glycol). Talk with your health care provider to discuss your results, treatment options, and if necessary, the need for more tests. Talk with your health care provider if you have any questions about your results.   This information is not intended to replace advice given to you by your health care provider.  Make sure you discuss any questions you have with your health care provider.   Document Released: 02/12/2004 Document Revised: 02/09/2014 Document Reviewed: 06/19/2013 Elsevier Interactive Patient Education 2016 [place] Hormone Test WHY AM I HAVING THIS TEST? A thyroid-stimulating hormone (TSH) test is a blood test that is done to measure the level of TSH, also known as thyrotropin, in your blood. TSH is produced by the pituitary gland. The pituitary gland is a small organ located just below the brain, behind your eyes and nasal passages. It is part of a system that monitors and maintains thyroid hormone levels and thyroid gland function. Thyroid hormones affect many body parts and systems,  including the system that affects how quickly your body burns fuel for energy. Your health care provider may recommend testing your TSH level if you have signs and symptoms of abnormal thyroid hormone levels. Knowing the level of TSH in your blood can help your health care provider:  Diagnose a thyroid gland or pituitary gland disorder.  Manage your condition and treatment if you have hypothyroidism or hyperthyroidism. WHAT KIND OF SAMPLE IS TAKEN? A blood sample is required for this test. It is usually collected by inserting a needle into a vein. HOW DO I PREPARE FOR THE TEST? There is no preparation required for this test. WHAT ARE THE REFERENCE RANGES? Reference rangesare considered healthy rangesestablished after testing a large group of healthy people. Reference rangesmay vary among different people, labs, and hospitals. It is your responsibility to obtain your test results. Ask the lab or department performing the test when and how you will get your results. Range of Normal Values:  Adult: 0.3-5 microunits/mL or 0.3-5 milliunits/L (SI units).  Newborn: 28-18 microunits/mL or 3-18 milliunits/L.  Cord: 3-12 microunits/mL or 3-12 milliunits/L. WHAT DO THE RESULTS MEAN? A high level of TSH may mean:  Your thyroid gland is not making enough thyroid hormones. When the thyroid gland does not make enough thyroid hormones, the pituitary gland releases TSH into the bloodstream. The higher-than-normal levels of TSH prompt the thyroid gland to release more thyroid hormones.  You are getting an insufficient level of thyroid hormone medicine, if you are receiving this type of treatment.  There is a problem with the pituitary gland (rare). A low level of TSH can indicate a problem with the pituitary gland. Talk with your health care provider to discuss your results, treatment options, and if necessary, the need for more tests. Talk with your health care provider if you have any questions about  your results.   This information is not intended to replace advice given to you by your health care provider. Make sure you discuss any questions you have with your health care provider.   Document Released: 02/14/2004 Document Revised: 02/09/2014 Document Reviewed: 06/14/2013 Elsevier Interactive Patient Education [employer].

## 2015-06-14 ENCOUNTER — Other Ambulatory Visit: Payer: Self-pay

## 2015-06-14 ENCOUNTER — Ambulatory Visit (INDEPENDENT_AMBULATORY_CARE_PROVIDER_SITE_OTHER): Payer: 59

## 2015-06-14 DIAGNOSIS — R002 Palpitations: Secondary | ICD-10-CM | POA: Diagnosis not present

## 2015-06-14 LAB — BASIC METABOLIC PANEL
BUN/Creatinine Ratio: 15 (ref 9–23)
BUN: 9 mg/dL (ref 6–24)
CALCIUM: 9.4 mg/dL (ref 8.7–10.2)
CO2: 25 mmol/L (ref 18–29)
Chloride: 101 mmol/L (ref 96–106)
Creatinine, Ser: 0.59 mg/dL (ref 0.57–1.00)
GFR, EST AFRICAN AMERICAN: 128 mL/min/{1.73_m2} (ref >59)
GFR, EST NON AFRICAN AMERICAN: 111 mL/min/{1.73_m2} (ref >59)
Glucose: 95 mg/dL (ref 65–99)
POTASSIUM: 4.8 mmol/L (ref 3.5–5.2)
Sodium: 141 mmol/L (ref 134–144)

## 2015-06-14 LAB — TSH: TSH: 1.56 u[IU]/mL (ref 0.450–4.500)

## 2015-06-14 LAB — T4, FREE: FREE T4: 1.06 ng/dL (ref 0.82–1.77)

## 2015-06-17 DIAGNOSIS — R000 Tachycardia, unspecified: Secondary | ICD-10-CM | POA: Diagnosis not present

## 2015-06-17 DIAGNOSIS — R002 Palpitations: Secondary | ICD-10-CM | POA: Diagnosis not present

## 2015-06-17 DIAGNOSIS — I493 Ventricular premature depolarization: Secondary | ICD-10-CM | POA: Diagnosis not present

## 2015-06-17 DIAGNOSIS — I10 Essential (primary) hypertension: Secondary | ICD-10-CM | POA: Diagnosis not present

## 2015-06-18 ENCOUNTER — Ambulatory Visit (INDEPENDENT_AMBULATORY_CARE_PROVIDER_SITE_OTHER): Payer: 59

## 2015-06-18 DIAGNOSIS — R002 Palpitations: Secondary | ICD-10-CM | POA: Diagnosis not present

## 2015-06-19 ENCOUNTER — Telehealth: Payer: Self-pay | Admitting: Cardiology

## 2015-06-19 ENCOUNTER — Encounter: Payer: Self-pay | Admitting: Cardiology

## 2015-06-19 ENCOUNTER — Ambulatory Visit (INDEPENDENT_AMBULATORY_CARE_PROVIDER_SITE_OTHER): Payer: 59 | Admitting: Cardiology

## 2015-06-19 DIAGNOSIS — R42 Dizziness and giddiness: Secondary | ICD-10-CM | POA: Diagnosis not present

## 2015-06-19 DIAGNOSIS — R55 Syncope and collapse: Secondary | ICD-10-CM | POA: Diagnosis not present

## 2015-06-19 DIAGNOSIS — R002 Palpitations: Secondary | ICD-10-CM | POA: Diagnosis not present

## 2015-06-19 NOTE — Patient Instructions (Signed)
Medication Instructions:  Your physician recommends that you continue on your current medications as directed. Please refer to the Current Medication list given to you today.   Labwork: None ordered  Testing/Procedures: None ordered  Follow-Up: Your physician recommends that you schedule a follow-up appointment after 30 day holter monitor is completed.   Date & Time: _________________________________________________________________   Any Other Special Instructions Will Be Listed Below (If Applicable).     If you need a refill on your cardiac medications before your next appointment, please call your pharmacy.

## 2015-06-19 NOTE — Progress Notes (Signed)
Cardiology Office Note   Date:  06/19/2015   ID:  Ariel Bryant, DOB 06-02-1970, MRN [id number]  Referring Doctor:  Baltazar Apo, MD   Cardiologist:   Wende Bushy, MD   Reason for consultation:  Chief Complaint  Patient presents with  . other    Pt. c/o dizziness and feeling faint, "these symtoms are getting worse" Meds reviewed by the patient verbally.       History of Present Illness: Ariel Bryant is a 45 y.o. female who presents for episode of dizziness/palpitations/fainting  Patient brings with her Holter monitor reports from 05/23/2015 and 06/01/2015.  Patient says that she started wearing the event monitor that was ordered from our office beginning yesterday. Prior to putting and on, she did experience similar episode that she's had in the past where she would feel dizzy, heart racing and feeling faint.  Also, she reports another episode sometime this morning of similar thing of dizziness, palpitations and feeling faint. She was already wearing the monitor at that time.   No headache, fever, cough, colds. No orthopnea, PND, edema.    ROS:  Please see the history of present illness. Aside from mentioned under HPI, all other systems are reviewed and negative.     Past Medical History  Diagnosis Date  . Anxiety   . PTSD (post-traumatic stress disorder)   . GERD (gastroesophageal reflux disease)   . Sinus tachycardia [facility])     Past Surgical History  Procedure Laterality Date  . Cesarean section      NSD x 3 followed by C/S for 8 + pounds breech  . Laparoscopic oopherectomy    . Cholecystectomy    . Vaginal hysterectomy      had BTL, then VH, later developed ovarian mass and had ooph at 45 yo     reports that she has quit smoking. She does not have any smokeless tobacco history on file. She reports that she does not drink alcohol.   family history includes Breast cancer in her paternal aunt and paternal aunt; Breast cancer (age of onset: 23) in  her maternal aunt; Hypertension in her father.   Patient says her mother has had  2 cardioversions for very high heart rates.  Patient Unclear of diagnosis.  Current Outpatient Prescriptions  Medication Sig Dispense Refill  . acetaminophen (TYLENOL) 325 MG tablet Take 650 mg by mouth as needed for mild pain, moderate pain, fever or headache.     Marland Kitchen acyclovir (ZOVIRAX) 400 MG tablet Take 400 mg by mouth as needed (AS NEEDED FOR COLD SORES).     Marland Kitchen ALPRAZolam (XANAX) 1 MG tablet Take 1 mg by mouth 4 (four) times daily as needed for anxiety.    . Ascorbic Acid (VITAMIN C) 1000 MG tablet Take 1,000 mg by mouth daily.    . Biotin 1 MG CAPS Take 1 capsule by mouth daily.    . Calcium Carb-Cholecalciferol (CALCIUM + D3) 600-200 MG-UNIT TABS Take 1 tablet by mouth 2 (two) times daily.     . cyclobenzaprine (FLEXERIL) 10 MG tablet Take 1 tablet by mouth at bedtime as needed for muscle spasms. Reported on 05/30/2015    . fluticasone (FLONASE) 50 MCG/ACT nasal spray Place 1 spray into both nostrils daily. For Eustachian Tube Dysfunction    . ibuprofen (ADVIL,MOTRIN) 200 MG tablet Take 800 mg by mouth as needed for fever, headache, mild pain, moderate pain or cramping.     . Magnesium 500 MG TABS  Take 1 tablet by mouth daily.    Marland Kitchen omeprazole (PRILOSEC) 40 MG capsule Take 40 mg by mouth daily.    . potassium gluconate 595 (99 K) MG TABS tablet Take 595 mg by mouth 3 (three) times a week.    . valACYclovir (VALTREX) 1000 MG tablet Take 2 tablets by mouth every 12 (twelve) hours as needed (AS NEEDED FOR COLD SORES OR SHINGLES OUTBREAK.).   6  . vitamin B-12 (CYANOCOBALAMIN) 1000 MCG tablet Take 1,000 mcg by mouth daily.     No current facility-administered medications for this visit.    Allergies: Codeine; Sulfa antibiotics; Azithromycin; Ciprofloxacin; Doxycycline monohydrate; Erythromycin; and Doxycycline    PHYSICAL EXAM: VS:  BP 92/72 mmHg  Pulse 67  Ht 5\' 5"  (1.651 m)  Wt 162 lb 8 oz (73.71 kg)   BMI 27.04 kg/m2 , Body mass index is 27.04 kg/(m^2). Wt Readings from Last 3 Encounters:  06/19/15 162 lb 8 oz (73.71 kg)  06/13/15 161 lb 6.4 oz (73.211 kg)  05/30/15 160 lb (72.576 kg)    Orthostatic VS for the past 24 hrs (Last 3 readings):  BP- Lying Pulse- Lying BP- Sitting Pulse- Sitting BP- Standing at 0 minutes Pulse- Standing at 0 minutes BP- Standing at 3 minutes Pulse- Standing at 3 minutes  06/19/15 1029 120/75 mmHg 68 108/82 mmHg 81 90/72 mmHg 85 100/82 mmHg 82    GENERAL:  well developed, well nourished, not in acute distress HEENT: normocephalic, pink conjunctivae, anicteric sclerae, no xanthelasma, normal dentition, oropharynx clear NECK:  no neck vein engorgement, JVP normal, no hepatojugular reflux, carotid upstroke brisk and symmetric, no bruit, no thyromegaly, no lymphadenopathy LUNGS:  good respiratory effort, clear to auscultation bilaterally CV:  PMI not displaced, no thrills, no lifts, S1 and S2 within normal limits, no palpable S3 or S4, no murmurs, no rubs, no gallops ABD:  Soft, nontender, nondistended, normoactive bowel sounds, no abdominal aortic bruit, no hepatomegaly, no splenomegaly MS: nontender back, no kyphosis, no scoliosis, no joint deformities EXT:  2+ DP/PT pulses, no edema, no varicosities, no cyanosis, no clubbing SKIN: warm, nondiaphoretic, normal turgor, no ulcers NEUROPSYCH: alert, oriented to person, place, and time, sensory/motor grossly intact, normal mood, appropriate affect  Recent Labs: 05/30/2015: Hemoglobin 13.2; Platelets 237 06/13/2015: BUN 9; Creatinine, Ser 0.59; Potassium 4.8; Sodium 141; TSH 1.560   Lipid Panel No results found for: CHOL, TRIG, HDL, CHOLHDL, VLDL, LDLCALC, LDLDIRECT   Other studies Reviewed:  EKG:  The ekg from 05/30/2015 from the ER was personally reviewed by me and it revealed sinus rhythm 80 BPM.  Additional studies/ records that were reviewed personally reviewed by me today include:   [name], MD - 06/10/2015 4:15 PM EDT 48 hour Holter monitor was performed which revealed predominant normal sinus rhythm with a mean heart rate of 75 bpm. This tachycardia was observed. Occasional premature ventricular contractions were present. Rare premature atrial contractions were observed. There were no diary entries.  Echocardiogram 06/14/2015: Left ventricle: The cavity size was normal. There was mild  concentric hypertrophy. Systolic function was normal. The  estimated ejection fraction was in the range of 55% to 60%. Wall  motion was normal; there were no regional wall motion  abnormalities. Left ventricular diastolic function parameters  were normal. - Pulmonary arteries: Systolic pressure was within the normal  range.  Outside records Holter monitor 05/31/2015: These were reviewed in detail with the patient. No arrhythmia noted. Sinus pause or sinus arrhythmia less than 2 seconds. PVCs. Heart  rate ranged from 52 to 123 BPM, average of 71  BPM. From 06/01/2015: These were reviewed in detail with the patient. No arrhythmia. PVCs. Heart rate ranged from 55-162 bpm, sinus tachycardia. Average heart rate of 79 BPM. Patient does not recall doing anything strenuous when her heart rate peaked at 162 BPM. This was around 1538.   ASSESSMENT AND PLAN: Palpitations PACs, PVCs  I had a lengthy and detailed discussion with the patient regarding the results of her Holter monitors. No significant arrhythmia noted. Recommend to continue with event monitor.  Recent labs within normal limits. Echocardiogram unremarkable   Dizziness or feeling faint Based on orthostatic vital signs, there may be element of orthostatic hypotension as noted with her blood pressure dropping to 90/72 from 120/75. Also, heart rate went up from 68 to peak of 92. Discussed this at length with patient. Recommend trial of increasing hydration, increase fluid intake, may do trial of low-calorie Gatorade, liberal  sodium intake, avoiding prolonged standing, use of support hoses. Also recommended to patient to keep a diary of her episodes. Also discussed that she may manually trigger her event monitor to record her rhythm during these episodes.   Current medicines are reviewed at length with the patient today.  The patient does not have concerns regarding medicines.  Labs/ tests ordered today include:  Orders Placed This Encounter  Procedures  . EKG 12-Lead    I had a lengthy and detailed discussion with the patient regarding diagnoses, prognosis, diagnostic options, treatment options , and side effects of medications.   I counseled the patient on importance of lifestyle modification including heart healthy diet, regular physical activity .   Disposition:   FU with undersigned after tests   I spent at least 45 minutes with the patient today and more than 50% of the time was spent counseling the patient and coordinating care.       Signed, Wende Bushy, MD  06/19/2015 11:12 AM    [place]

## 2015-06-19 NOTE — Telephone Encounter (Signed)
Patient wants to be seen today for faintness and dizzy spells.  Says she is bringing results of previous holter monitor.  Added to schedule at 10:15 am

## 2015-06-19 NOTE — Telephone Encounter (Signed)
Patient here in office being seen at this time.

## 2015-07-25 ENCOUNTER — Ambulatory Visit (INDEPENDENT_AMBULATORY_CARE_PROVIDER_SITE_OTHER): Payer: 59 | Admitting: Cardiology

## 2015-07-25 ENCOUNTER — Encounter: Payer: Self-pay | Admitting: Cardiology

## 2015-07-25 VITALS — BP 100/70 | HR 90 | Ht 65.0 in | Wt 163.2 lb

## 2015-07-25 DIAGNOSIS — I493 Ventricular premature depolarization: Secondary | ICD-10-CM

## 2015-07-25 DIAGNOSIS — R000 Tachycardia, unspecified: Secondary | ICD-10-CM

## 2015-07-25 DIAGNOSIS — R002 Palpitations: Secondary | ICD-10-CM

## 2015-07-25 MED ORDER — VERAPAMIL HCL 40 MG PO TABS: 40.0000 mg | ORAL_TABLET | Freq: Three times a day (TID) | ORAL | Status: DC

## 2015-07-25 NOTE — Progress Notes (Signed)
Cardiology Office Note   Date:  07/25/2015   ID:  AVAROSE RATHORE, DOB [birth year], MRN [id number]  Referring Doctor:  Baltazar Apo, MD   Cardiologist:   Wende Bushy, MD   Reason for consultation:  Chief Complaint  Patient presents with  . other    Follow up from Echo and 30 day event monitor. Pt. c/o palpitations, dizziness and fatigue.       History of Present Illness: Ariel Bryant is a 45 y.o. female who presents for Follow-up after event monitor.   Since her last visit, despite increasing hydration, maneuvers to avoid orthostatic drop in her blood pressure, patient continues to feel the same. She continues to have episodes of palpitations and fatigue and dizziness.  No headache, fever, cough, colds. No orthopnea, PND, edema. No significant chest pain. She recalls having had a stress test in the past through [facility] clinic and she was told that this was normal.  ROS:  Please see the history of present illness. Aside from mentioned under HPI, all other systems are reviewed and negative.    Past Medical History  Diagnosis Date  . Anxiety   . PTSD (post-traumatic stress disorder)   . GERD (gastroesophageal reflux disease)   . Sinus tachycardia [facility])     Past Surgical History  Procedure Laterality Date  . Cesarean section      NSD x 3 followed by C/S for 8 + pounds breech  . Laparoscopic oopherectomy    . Cholecystectomy    . Vaginal hysterectomy      had BTL, then VH, later developed ovarian mass and had ooph at 46 yo     reports that she has quit smoking. She does not have any smokeless tobacco history on file. She reports that she does not drink alcohol.   family history includes Breast cancer in her paternal aunt and paternal aunt; Breast cancer (age of onset: 47) in her maternal aunt; Hypertension in her father.   Patient says her mother has had  2 cardioversions for very high heart rates.  Patient Unclear of diagnosis.  Current Outpatient  Prescriptions  Medication Sig Dispense Refill  . acetaminophen (TYLENOL) 325 MG tablet Take 650 mg by mouth as needed for mild pain, moderate pain, fever or headache.     Marland Kitchen acyclovir (ZOVIRAX) 400 MG tablet Take 400 mg by mouth as needed (AS NEEDED FOR COLD SORES).     Marland Kitchen ALPRAZolam (XANAX) 1 MG tablet Take 1 mg by mouth 4 (four) times daily as needed for anxiety.    . Ascorbic Acid (VITAMIN C) 1000 MG tablet Take 1,000 mg by mouth daily.    . Biotin 1 MG CAPS Take 1 capsule by mouth daily.    . Calcium Carb-Cholecalciferol (CALCIUM + D3) 600-200 MG-UNIT TABS Take 1 tablet by mouth 2 (two) times daily.     . cyclobenzaprine (FLEXERIL) 10 MG tablet Take 1 tablet by mouth at bedtime as needed for muscle spasms. Reported on 05/30/2015    . fluticasone (FLONASE) 50 MCG/ACT nasal spray Place 1 spray into both nostrils daily. For Eustachian Tube Dysfunction    . Garlic (GARLIQUE PO) Take by mouth daily.    Marland Kitchen ibuprofen (ADVIL,MOTRIN) 200 MG tablet Take 800 mg by mouth as needed for fever, headache, mild pain, moderate pain or cramping.     . Magnesium 500 MG TABS Take 1 tablet by mouth daily.    Marland Kitchen omeprazole (PRILOSEC) 40 MG capsule Take 40  mg by mouth daily.    . potassium gluconate 595 (99 K) MG TABS tablet Take 595 mg by mouth 3 (three) times a week.    . valACYclovir (VALTREX) 1000 MG tablet Take 2 tablets by mouth every 12 (twelve) hours as needed (AS NEEDED FOR COLD SORES OR SHINGLES OUTBREAK.).   6  . vitamin B-12 (CYANOCOBALAMIN) 1000 MCG tablet Take 1,000 mcg by mouth daily.    . verapamil (CALAN) 40 MG tablet Take 1 tablet (40 mg total) by mouth 3 (three) times daily. 90 tablet 6   No current facility-administered medications for this visit.    Allergies: Codeine; Sulfa antibiotics; Azithromycin; Ciprofloxacin; Doxycycline monohydrate; Erythromycin; and Doxycycline    PHYSICAL EXAM: VS:  BP 100/70 mmHg  Pulse 90  Ht 5\' 5"  (1.651 m)  Wt 163 lb 4 oz (74.05 kg)  BMI 27.17 kg/m2  SpO2 99%  , Body mass index is 27.17 kg/(m^2). Wt Readings from Last 3 Encounters:  07/25/15 163 lb 4 oz (74.05 kg)  06/19/15 162 lb 8 oz (73.71 kg)  06/13/15 161 lb 6.4 oz (73.211 kg)     GENERAL:  well developed, well nourished, not in acute distress HEENT: normocephalic, pink conjunctivae, anicteric sclerae, no xanthelasma, normal dentition, oropharynx clear NECK:  no neck vein engorgement, JVP normal, no hepatojugular reflux, carotid upstroke brisk and symmetric, no bruit, no thyromegaly, no lymphadenopathy LUNGS:  good respiratory effort, clear to auscultation bilaterally CV:  PMI not displaced, no thrills, no lifts, S1 and S2 within normal limits, no palpable S3 or S4, no murmurs, no rubs, no gallops ABD:  Soft, nontender, nondistended, normoactive bowel sounds, no abdominal aortic bruit, no hepatomegaly, no splenomegaly MS: nontender back, no kyphosis, no scoliosis, no joint deformities EXT:  2+ DP/PT pulses, no edema, no varicosities, no cyanosis, no clubbing SKIN: warm, nondiaphoretic, normal turgor, no ulcers NEUROPSYCH: alert, oriented to person, place, and time, sensory/motor grossly intact, normal mood, appropriate affect  Recent Labs: 05/30/2015: Hemoglobin 13.2; Platelets 237 06/13/2015: BUN 9; Creatinine, Ser 0.59; Potassium 4.8; Sodium 141; TSH 1.560   Lipid Panel No results found for: CHOL, TRIG, HDL, CHOLHDL, VLDL, LDLCALC, LDLDIRECT   Other studies Reviewed:  EKG:  The ekg from 05/30/2015 from the ER was personally reviewed by me and it revealed sinus rhythm 80 BPM.  Additional studies/ records that were reviewed personally reviewed by me today include:   [name], MD - 06/10/2015 4:15 PM EDT 48 hour Holter monitor was performed which revealed predominant normal sinus rhythm with a mean heart rate of 75 bpm. This tachycardia was observed. Occasional premature ventricular contractions were present. Rare premature atrial contractions were observed. There were no  diary entries.  Echocardiogram 06/14/2015: Left ventricle: The cavity size was normal. There was mild  concentric hypertrophy. Systolic function was normal. The  estimated ejection fraction was in the range of 55% to 60%. Wall  motion was normal; there were no regional wall motion  abnormalities. Left ventricular diastolic function parameters  were normal. - Pulmonary arteries: Systolic pressure was within the normal  range.  Outside records Holter monitor 05/31/2015: These were reviewed in detail with the patient. No arrhythmia noted. Sinus pause or sinus arrhythmia less than 2 seconds. PVCs. Heart rate ranged from 52 to 123 BPM, average of 71  BPM. From 06/01/2015: These were reviewed in detail with the patient. No arrhythmia. PVCs. Heart rate ranged from 55-162 bpm, sinus tachycardia. Average heart rate of 79 BPM. Patient does not recall doing anything strenuous  when her heart rate peaked at 162 BPM. This was around 1538.  Event monitor 07/18/2015:  Monitoring period was 06/18/2015 to 07/17/2015. baseline sample showed sinus rhythm, 80 BPM.  Automatically detected event showed sinus tachycardia of 141 BPM, 06/22/2015 9:28 AM  Patient had 55 manually detected events. Symptoms reported were flutter or skipped beats, tired or fatigued. Corresponding rhythm were: Sinus rhythm/sinus arrhythmia Sinus rhythm with isolated PVCs Sinus rhythm with isolated PACs Sinus tachycardia, maximum heart rate of 154 BPM, 07/14/2015 1:33 PM.  No significant arrhythmia noted.   ASSESSMENT AND PLAN: Palpitations Dizziness or feeling faint PACs, PVCs Sinus tachycardia   I had a lengthy and detailed discussion with the patient regarding the results of her Holter monitors and event monitor.No significant arrhythmia noted. We discussed the results of her event monitor at length. As mentioned in the report, not all of her symptoms corresponded with a fast heart rate. Some corresponded with normal  sinus rhythm and normal sinus rate with isolated ectopies. She may have a combination of orthostatic changes in her blood pressure as well as possibly inappropriate sinus tachycardia. Recent labs within normal limits. Echocardiogram unremarkable Obtain prior stress test. Recommend trial with calcium channel blocker, verapamil 40 mg by mouth 3 times a day. Follow-up in the office in one month. Other possible treatment options would be ivabradine. If she continues to be symptomatic despite workup of possible noncardiac etiologies, may consider EP consultation for their opinion. Recommend to continue with increasing hydration, increase fluid intake, may do trial of low-calorie Gatorade, liberal sodium intake, avoiding prolonged standing, use of support hoses.  Current medicines are reviewed at length with the patient today.  The patient does not have concerns regarding medicines.  Labs/ tests ordered today include:  No orders of the defined types were placed in this encounter.    I had a lengthy and detailed discussion with the patient regarding diagnoses, prognosis, diagnostic options, treatment options , and side effects of medications.   I counseled the patient on importance of lifestyle modification including heart healthy diet, regular physical activity .  Disposition:   FU with undersigned after tests     Signed, Wende Bushy, MD  07/25/2015 10:12 AM    [place]

## 2015-07-25 NOTE — Patient Instructions (Signed)
Medication Instructions:  Your physician has recommended you make the following change in your medication:  1. Start Verapamil 40 mg Three times daily   Labwork: None ordered  Testing/Procedures: None ordered  Follow-Up: Your physician recommends that you schedule a follow-up appointment in: 1 month with Dr. Yvone Neu.  It was a pleasure seeing you today here in the office. Please do not hesitate to give [place] a call back if you have any further questions. [place], BSN      Any Other Special Instructions Will Be Listed Below (If Applicable).     If you need a refill on your cardiac medications before your next appointment, please call your pharmacy.

## 2015-07-29 DIAGNOSIS — R42 Dizziness and giddiness: Secondary | ICD-10-CM | POA: Diagnosis not present

## 2015-08-23 DIAGNOSIS — H903 Sensorineural hearing loss, bilateral: Secondary | ICD-10-CM | POA: Diagnosis not present

## 2015-08-23 DIAGNOSIS — R42 Dizziness and giddiness: Secondary | ICD-10-CM | POA: Diagnosis not present

## 2015-08-26 DIAGNOSIS — Z79899 Other long term (current) drug therapy: Secondary | ICD-10-CM | POA: Diagnosis not present

## 2015-08-26 DIAGNOSIS — F4310 Post-traumatic stress disorder, unspecified: Secondary | ICD-10-CM | POA: Diagnosis not present

## 2015-08-28 ENCOUNTER — Ambulatory Visit (INDEPENDENT_AMBULATORY_CARE_PROVIDER_SITE_OTHER): Payer: 59 | Admitting: Cardiology

## 2015-08-28 ENCOUNTER — Encounter: Payer: Self-pay | Admitting: Cardiology

## 2015-08-28 VITALS — BP 90/60 | HR 82 | Ht 65.0 in | Wt 162.5 lb

## 2015-08-28 DIAGNOSIS — R002 Palpitations: Secondary | ICD-10-CM

## 2015-08-28 DIAGNOSIS — R000 Tachycardia, unspecified: Secondary | ICD-10-CM

## 2015-08-28 DIAGNOSIS — I493 Ventricular premature depolarization: Secondary | ICD-10-CM

## 2015-08-28 NOTE — Progress Notes (Signed)
Cardiology Office Note   Date:  08/28/2015   ID:  BRIGHTLY DABU, DOB 1970/04/08, MRN [id number]  Referring Doctor:  Baltazar Apo, MD   Cardiologist:   Wende Bushy, MD   Reason for consultation:  Chief Complaint  Patient presents with  . Other    1 month follow up. Meds reviewed by the patient verbally. "doing well." Pt. did see ENT but no vertigo diagnosed.       History of Present Illness: Ariel Bryant is a 45 y.o. female who presents for Follow-up after Initiation of medication.  Since her last visit, despite increasing hydration, maneuvers to avoid orthostatic drop in her blood pressure, patient continues to feel the same. She continues to have episodes of palpitations and fatigue and dizziness. She did report trying the verapamil twice and felt that her heart rate dropped to the 40s with manual palpation. She did not check her blood pressure during those times. Because of the slow heart rate, she decided to stop taking the verapamil. She was unable to inform the office when she did so. She says she would like to see the EP specialist.  No headache, fever, cough, colds. No orthopnea, PND, edema. No significant chest pain. She recalls having had a stress test in the past through [facility] clinic and she was told that this was normal.  ROS:  Please see the history of present illness. Aside from mentioned under HPI, all other systems are reviewed and negative.    Past Medical History:  Diagnosis Date  . Anxiety   . GERD (gastroesophageal reflux disease)   . PTSD (post-traumatic stress disorder)   . Sinus tachycardia ([place])     Past Surgical History:  Procedure Laterality Date  . CESAREAN SECTION     NSD x 3 followed by C/S for 8 + pounds breech  . CHOLECYSTECTOMY    . LAPAROSCOPIC OOPHERECTOMY    . VAGINAL HYSTERECTOMY     had BTL, then VH, later developed ovarian mass and had ooph at 45 yo     reports that she has quit smoking. She has never used  smokeless tobacco. She reports that she does not drink alcohol.   family history includes Breast cancer in her paternal aunt and paternal aunt; Breast cancer (age of onset: 2) in her maternal aunt; Hypertension in her father.   Patient says her mother has had  2 cardioversions for very high heart rates.  Patient Unclear of diagnosis.  Current Outpatient Prescriptions  Medication Sig Dispense Refill  . acetaminophen (TYLENOL) 325 MG tablet Take 650 mg by mouth as needed for mild pain, moderate pain, fever or headache.     Marland Kitchen acyclovir (ZOVIRAX) 400 MG tablet Take 400 mg by mouth as needed (AS NEEDED FOR COLD SORES).     Marland Kitchen ALPRAZolam (XANAX) 1 MG tablet Take 1 mg by mouth 4 (four) times daily as needed for anxiety.    . Ascorbic Acid (VITAMIN C) 1000 MG tablet Take 1,000 mg by mouth daily.    . Biotin 1 MG CAPS Take 1 capsule by mouth daily.    . Calcium Carb-Cholecalciferol (CALCIUM + D3) 600-200 MG-UNIT TABS Take 1 tablet by mouth 2 (two) times daily.     . cyclobenzaprine (FLEXERIL) 10 MG tablet Take 1 tablet by mouth at bedtime as needed for muscle spasms. Reported on 05/30/2015    . fluticasone (FLONASE) 50 MCG/ACT nasal spray Place 1 spray into both nostrils daily. For Eustachian Tube  Dysfunction    . Garlic (GARLIQUE PO) Take by mouth daily.    Marland Kitchen ibuprofen (ADVIL,MOTRIN) 200 MG tablet Take 800 mg by mouth as needed for fever, headache, mild pain, moderate pain or cramping.     . Magnesium 500 MG TABS Take 1 tablet by mouth daily.    Marland Kitchen omeprazole (PRILOSEC) 40 MG capsule Take 40 mg by mouth daily.    . potassium gluconate 595 (99 K) MG TABS tablet Take 595 mg by mouth 3 (three) times a week.    . valACYclovir (VALTREX) 1000 MG tablet Take 2 tablets by mouth every 12 (twelve) hours as needed (AS NEEDED FOR COLD SORES OR SHINGLES OUTBREAK.).   6  . vitamin B-12 (CYANOCOBALAMIN) 1000 MCG tablet Take 1,000 mcg by mouth daily.     No current facility-administered medications for this visit.      Allergies: Codeine; Sulfa antibiotics; Azithromycin; Ciprofloxacin; Doxycycline monohydrate; Erythromycin; and Doxycycline    PHYSICAL EXAM: VS:  BP 90/60 (BP Location: Left Arm, Patient Position: Sitting, Cuff Size: Normal)   Pulse 82   Ht 5\' 5"  (1.651 m)   Wt 162 lb 8 oz (73.7 kg)   BMI 27.04 kg/m  , Body mass index is 27.04 kg/m. Wt Readings from Last 3 Encounters:  08/28/15 162 lb 8 oz (73.7 kg)  07/25/15 163 lb 4 oz (74 kg)  06/19/15 162 lb 8 oz (73.7 kg)     GENERAL:  well developed, well nourished, not in acute distress HEENT: normocephalic, pink conjunctivae, anicteric sclerae, no xanthelasma, normal dentition, oropharynx clear NECK:  no neck vein engorgement, JVP normal, no hepatojugular reflux, carotid upstroke brisk and symmetric, no bruit, no thyromegaly, no lymphadenopathy LUNGS:  good respiratory effort, clear to auscultation bilaterally CV:  PMI not displaced, no thrills, no lifts, S1 and S2 within normal limits, no palpable S3 or S4, no murmurs, no rubs, no gallops ABD:  Soft, nontender, nondistended, normoactive bowel sounds, no abdominal aortic bruit, no hepatomegaly, no splenomegaly MS: nontender back, no kyphosis, no scoliosis, no joint deformities EXT:  2+ DP/PT pulses, no edema, no varicosities, no cyanosis, no clubbing SKIN: warm, nondiaphoretic, normal turgor, no ulcers NEUROPSYCH: alert, oriented to person, place, and time, sensory/motor grossly intact, normal mood, appropriate affect  Recent Labs: 05/30/2015: Hemoglobin 13.2; Platelets 237 06/13/2015: BUN 9; Creatinine, Ser 0.59; Potassium 4.8; Sodium 141; TSH 1.560   Lipid Panel No results found for: CHOL, TRIG, HDL, CHOLHDL, VLDL, LDLCALC, LDLDIRECT   Other studies Reviewed:  EKG:  The ekg from 05/30/2015 from the ER was personally reviewed by me and it revealed sinus rhythm 80 BPM.  Additional studies/ records that were reviewed personally reviewed by me today include:   [name],  MD - 06/10/2015 4:15 PM EDT 48 hour Holter monitor was performed which revealed predominant normal sinus rhythm with a mean heart rate of 75 bpm. This tachycardia was observed. Occasional premature ventricular contractions were present. Rare premature atrial contractions were observed. There were no diary entries.  Echocardiogram 06/14/2015: Left ventricle: The cavity size was normal. There was mild  concentric hypertrophy. Systolic function was normal. The  estimated ejection fraction was in the range of 55% to 60%. Wall  motion was normal; there were no regional wall motion  abnormalities. Left ventricular diastolic function parameters  were normal. - Pulmonary arteries: Systolic pressure was within the normal  range.  Outside records Holter monitor 05/31/2015: These were reviewed in detail with the patient. No arrhythmia noted. Sinus pause or sinus  arrhythmia less than 2 seconds. PVCs. Heart rate ranged from 52 to 123 BPM, average of 71  BPM. From 06/01/2015: These were reviewed in detail with the patient. No arrhythmia. PVCs. Heart rate ranged from 55-162 bpm, sinus tachycardia. Average heart rate of 79 BPM. Patient does not recall doing anything strenuous when her heart rate peaked at 162 BPM. This was around 1538.  Event monitor 07/18/2015:  Monitoring period was 06/18/2015 to 07/17/2015. baseline sample showed sinus rhythm, 80 BPM.  Automatically detected event showed sinus tachycardia of 141 BPM, 06/22/2015 9:28 AM  Patient had 55 manually detected events. Symptoms reported were flutter or skipped beats, tired or fatigued. Corresponding rhythm were: Sinus rhythm/sinus arrhythmia Sinus rhythm with isolated PVCs Sinus rhythm with isolated PACs Sinus tachycardia, maximum heart rate of 154 BPM, 07/14/2015 1:33 PM.  No significant arrhythmia noted.   ASSESSMENT AND PLAN: Palpitations Dizziness or feeling faint PACs, PVCs Sinus tachycardia   I had a lengthy and detailed  discussion with the patient regarding the results of her Holter monitors and event monitor. No significant arrhythmia noted.  As mentioned in the report, not all of her symptoms corresponded with a fast heart rate. Some corresponded with normal sinus rhythm, and normal sinus rate with isolated ectopies. She may have a combination of orthostatic changes in her blood pressure as well as possibly inappropriate sinus tachycardia. Recent labs within normal limits. Echocardiogram unremarkable. Obtain prior stress test from [facility]. Patient was told that it was normal. Recommend trial with calcium channel blocker, verapamil 40 mg by mouth 3 times a day. Patient tried to take the medication twice, but noted that her heart rate dropped into the 40s. She decided to stop taking the medication altogether. She did not inform our office that she stopped taking the medication.  Discussed option of taking the verapamil when necessary only.  Other possible treatment options would be ivabradine (off label).  At this point, patient requested to be referred to EP for further evaluation and management.  Recommend to continue with increasing hydration, increase fluid intake, may do trial of low-calorie Gatorade, liberal sodium intake, avoiding prolonged standing, use of support hoses.  Current medicines are reviewed at length with the patient today.  The patient does not have concerns regarding medicines.  Labs/ tests ordered today include:  Orders Placed This Encounter  Procedures  . Ambulatory referral to Cardiac Electrophysiology    I had a lengthy and detailed discussion with the patient regarding diagnoses, prognosis, diagnostic options, treatment options , and side effects of medications.   I counseled the patient on importance of lifestyle modification including heart healthy diet, regular physical activity .  Disposition:   FU with undersigned after EP consultation     Signed, Wende Bushy,  MD  08/28/2015 9:52 AM    [place]

## 2015-08-28 NOTE — Patient Instructions (Addendum)
Follow-Up: Your physician recommends that you schedule a follow-up appointment as needed with Dr. Yvone Neu.  You have been referred to Dr. Caryl Comes which is an electrophysiologist specialist for evaluation.   It was a pleasure seeing you today here in the office. Please do not hesitate to give [place] a call back if you have any further questions. [place], BSN        If you need a refill on your cardiac medications before your next appointment, please call your pharmacy.

## 2015-08-29 DIAGNOSIS — L820 Inflamed seborrheic keratosis: Secondary | ICD-10-CM | POA: Diagnosis not present

## 2015-08-29 DIAGNOSIS — D2371 Other benign neoplasm of skin of right lower limb, including hip: Secondary | ICD-10-CM | POA: Diagnosis not present

## 2015-08-29 DIAGNOSIS — Z85828 Personal history of other malignant neoplasm of skin: Secondary | ICD-10-CM | POA: Diagnosis not present

## 2015-10-03 ENCOUNTER — Encounter: Payer: Self-pay | Admitting: *Deleted

## 2015-10-03 ENCOUNTER — Ambulatory Visit: Payer: 59 | Admitting: Internal Medicine

## 2015-10-22 DIAGNOSIS — Z23 Encounter for immunization: Secondary | ICD-10-CM | POA: Diagnosis not present

## 2015-11-14 ENCOUNTER — Encounter: Payer: Self-pay | Admitting: Internal Medicine

## 2015-11-14 ENCOUNTER — Ambulatory Visit (INDEPENDENT_AMBULATORY_CARE_PROVIDER_SITE_OTHER): Payer: Medicare Other | Admitting: Internal Medicine

## 2015-11-14 ENCOUNTER — Telehealth: Payer: Self-pay | Admitting: Internal Medicine

## 2015-11-14 VITALS — BP 100/82 | HR 66 | Ht 65.0 in | Wt 162.1 lb

## 2015-11-14 DIAGNOSIS — R002 Palpitations: Secondary | ICD-10-CM | POA: Diagnosis not present

## 2015-11-14 DIAGNOSIS — I493 Ventricular premature depolarization: Secondary | ICD-10-CM

## 2015-11-14 NOTE — Patient Instructions (Signed)
Medication Instructions: - Your physician recommends that you continue on your current medications as directed. Please refer to the Current Medication list given to you today.  Labwork: - none ordered  Procedures/Testing: - none ordered  Follow-Up: - Your physician recommends that you schedule a follow-up appointment in: 3 months with Dr. Klein.   Any Additional Special Instructions Will Be Listed Below (If Applicable).     If you need a refill on your cardiac medications before your next appointment, please call your pharmacy.   

## 2015-11-14 NOTE — Telephone Encounter (Signed)
I called and spoke with the patient. I gave her Dr. Ane Payment Shimpi's name for [facility].
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
## 2015-11-14 NOTE — Telephone Encounter (Signed)
Follow Up:   Pt just saw Dr Caryl Comes earlier today. She said he was telling her about a GI doctor at [facility].She would like the name please,so her primary doctor can refer her.

## 2015-11-18 DIAGNOSIS — Z79899 Other long term (current) drug therapy: Secondary | ICD-10-CM | POA: Diagnosis not present

## 2015-11-18 DIAGNOSIS — F410 Panic disorder [episodic paroxysmal anxiety] without agoraphobia: Secondary | ICD-10-CM | POA: Diagnosis not present

## 2015-11-20 DIAGNOSIS — Z1389 Encounter for screening for other disorder: Secondary | ICD-10-CM | POA: Diagnosis not present

## 2015-11-20 DIAGNOSIS — N390 Urinary tract infection, site not specified: Secondary | ICD-10-CM | POA: Diagnosis not present

## 2015-11-20 DIAGNOSIS — K130 Diseases of lips: Secondary | ICD-10-CM | POA: Diagnosis not present

## 2015-11-29 ENCOUNTER — Other Ambulatory Visit: Payer: Self-pay | Admitting: Family Medicine

## 2015-11-29 DIAGNOSIS — Z1231 Encounter for screening mammogram for malignant neoplasm of breast: Secondary | ICD-10-CM

## 2015-12-03 ENCOUNTER — Ambulatory Visit
Admission: RE | Admit: 2015-12-03 | Discharge: 2015-12-03 | Disposition: A | Discharge status: Home / Self Care | Payer: Medicare Other | Source: Ambulatory Visit | Attending: Family Medicine | Admitting: Family Medicine

## 2015-12-03 DIAGNOSIS — Z1231 Encounter for screening mammogram for malignant neoplasm of breast: Secondary | ICD-10-CM | POA: Insufficient documentation

## 2016-02-10 DIAGNOSIS — R7309 Other abnormal glucose: Secondary | ICD-10-CM | POA: Diagnosis not present

## 2016-02-10 DIAGNOSIS — L659 Nonscarring hair loss, unspecified: Secondary | ICD-10-CM | POA: Diagnosis not present

## 2016-02-10 DIAGNOSIS — G901 Familial dysautonomia [Riley-Day]: Secondary | ICD-10-CM | POA: Diagnosis not present

## 2016-02-10 DIAGNOSIS — N952 Postmenopausal atrophic vaginitis: Secondary | ICD-10-CM | POA: Diagnosis not present

## 2016-02-10 DIAGNOSIS — G4700 Insomnia, unspecified: Secondary | ICD-10-CM | POA: Diagnosis not present

## 2016-02-10 DIAGNOSIS — N8110 Cystocele, unspecified: Secondary | ICD-10-CM | POA: Diagnosis not present

## 2016-02-10 DIAGNOSIS — K219 Gastro-esophageal reflux disease without esophagitis: Secondary | ICD-10-CM | POA: Diagnosis not present

## 2016-02-10 DIAGNOSIS — Z0000 Encounter for general adult medical examination without abnormal findings: Secondary | ICD-10-CM | POA: Diagnosis not present

## 2016-02-10 DIAGNOSIS — E781 Pure hyperglyceridemia: Secondary | ICD-10-CM | POA: Diagnosis not present

## 2016-02-13 ENCOUNTER — Ambulatory Visit: Payer: Medicare Other | Admitting: Internal Medicine

## 2016-02-13 DIAGNOSIS — E876 Hypokalemia: Secondary | ICD-10-CM | POA: Diagnosis not present

## 2016-02-13 DIAGNOSIS — K219 Gastro-esophageal reflux disease without esophagitis: Secondary | ICD-10-CM | POA: Diagnosis not present

## 2016-02-13 DIAGNOSIS — R7309 Other abnormal glucose: Secondary | ICD-10-CM | POA: Diagnosis not present

## 2016-02-13 DIAGNOSIS — L659 Nonscarring hair loss, unspecified: Secondary | ICD-10-CM | POA: Diagnosis not present

## 2016-03-17 DIAGNOSIS — F411 Generalized anxiety disorder: Secondary | ICD-10-CM | POA: Diagnosis not present

## 2016-03-17 DIAGNOSIS — Z79899 Other long term (current) drug therapy: Secondary | ICD-10-CM | POA: Diagnosis not present

## 2016-03-17 DIAGNOSIS — F410 Panic disorder [episodic paroxysmal anxiety] without agoraphobia: Secondary | ICD-10-CM | POA: Diagnosis not present

## 2016-03-31 DIAGNOSIS — R110 Nausea: Secondary | ICD-10-CM | POA: Diagnosis not present

## 2016-03-31 DIAGNOSIS — R152 Fecal urgency: Secondary | ICD-10-CM | POA: Diagnosis not present

## 2016-04-02 ENCOUNTER — Ambulatory Visit (INDEPENDENT_AMBULATORY_CARE_PROVIDER_SITE_OTHER): Payer: Medicare Other | Admitting: Internal Medicine

## 2016-04-02 VITALS — BP 100/70 | HR 80 | Ht 65.0 in | Wt 171.8 lb

## 2016-04-02 DIAGNOSIS — G901 Familial dysautonomia [Riley-Day]: Secondary | ICD-10-CM

## 2016-04-02 DIAGNOSIS — G909 Disorder of the autonomic nervous system, unspecified: Secondary | ICD-10-CM

## 2016-04-02 DIAGNOSIS — I493 Ventricular premature depolarization: Secondary | ICD-10-CM | POA: Diagnosis not present

## 2016-04-02 NOTE — Progress Notes (Signed)
Patient Care Team: Ardelle Lesches, MD as PCP - General (Internal Medicine)   HPI  Ariel Bryant is a 46 y.o. female Seen in follow-up for symptoms consistent with dysautonomia. She's had significant GI symptoms and she was referred to [facility].  At the last visit we encouraged exercise salt supplementation and consideration of addressing the psychosocial aspects related to her PTSD  She also has symptomatic PVCs.  We reviewed her symptoms again. She has 3 symptom complexes including flutters which correlate by the event recorder with PVCs. She has nocturnal symptoms that awaken her from sleep lasting 2-3 minutes. We have no arrhythmia correlation of these. Mid she has palpitations echo on through the day. These seem to be related possibly to the PVCs. They are aggravated by stress.  Her father died recently. She is struggling with this.  Reviewed the notes from [place]. Not particularly enlightening unfortunately.  Records and Results Reviewed. As above   Past Medical History:  Diagnosis Date  . Anxiety   . GERD (gastroesophageal reflux disease)   . PTSD (post-traumatic stress disorder)   . Sinus tachycardia     Past Surgical History:  Procedure Laterality Date  . CESAREAN SECTION     NSD x 3 followed by C/S for 8 + pounds breech  . CHOLECYSTECTOMY    . LAPAROSCOPIC OOPHERECTOMY    . VAGINAL HYSTERECTOMY     had BTL, then VH, later developed ovarian mass and had ooph at 46 yo    Current Outpatient Prescriptions  Medication Sig Dispense Refill  . acetaminophen (TYLENOL) 325 MG tablet Take 650 mg by mouth as needed for mild pain, moderate pain, fever or headache.     Marland Kitchen acyclovir (ZOVIRAX) 400 MG tablet Take 400 mg by mouth as needed (AS NEEDED FOR COLD SORES).     Marland Kitchen ALPRAZolam (XANAX) 1 MG tablet Take 1 mg by mouth 4 (four) times daily as needed for anxiety.    . Ascorbic Acid (VITAMIN C) 1000 MG tablet Take 1,000 mg by mouth daily.    . Biotin 1 MG CAPS Take 1 capsule  by mouth daily.    . Calcium Carb-Cholecalciferol (CALCIUM + D3) 600-200 MG-UNIT TABS Take 1 tablet by mouth 2 (two) times daily.     . cyclobenzaprine (FLEXERIL) 10 MG tablet Take 1 tablet by mouth at bedtime as needed for muscle spasms. Reported on 05/30/2015    . FLUARIX QUADRIVALENT 0.5 ML injection Inject 0.5 mLs as directed once.  0  . fluticasone (FLONASE) 50 MCG/ACT nasal spray Place 1 spray into both nostrils daily. For Eustachian Tube Dysfunction    . Garlic (GARLIQUE PO) Take by mouth daily.    Marland Kitchen ibuprofen (ADVIL,MOTRIN) 200 MG tablet Take 800 mg by mouth as needed for fever, headache, mild pain, moderate pain or cramping.     . Magnesium 500 MG TABS Take 1 tablet by mouth daily as needed.     Marland Kitchen omeprazole (PRILOSEC) 40 MG capsule Take 40 mg by mouth daily.    . potassium gluconate 595 (99 K) MG TABS tablet Take 595 mg by mouth 3 (three) times a week.    . valACYclovir (VALTREX) 1000 MG tablet Take 2 tablets by mouth every 12 (twelve) hours as needed (AS NEEDED FOR COLD SORES OR SHINGLES OUTBREAK.).   6  . vitamin B-12 (CYANOCOBALAMIN) 1000 MCG tablet Take 1,000 mcg by mouth as needed.      No current facility-administered medications for this visit.  Allergies  Allergen Reactions  . Codeine Shortness Of Breath  . Sulfa Antibiotics Anaphylaxis  . Azithromycin Other (See Comments)  . Ciprofloxacin Other (See Comments)  . Doxycycline Monohydrate Other (See Comments)  . Erythromycin Other (See Comments)  . Doxycycline Rash      Review of Systems negative except from HPI and PMH  Physical Exam BP 100/70 (BP Location: Left Arm, Patient Position: Sitting, Cuff Size: Normal)   Pulse 80   Ht 5\' 5"  (1.651 m)   Wt 171 lb 12 oz (77.9 kg)   BMI 28.58 kg/m  Well developed and well nourished in no acute distress HENT normal E scleral and icterus clear Neck Supple JVP flat; carotids brisk and full Clear to ausculation  Regular rate and rhythm, no murmurs gallops or  rub Soft with active bowel sounds No clubbing cyanosis Edema Alert and oriented, grossly normal motor and sensory function Skin Warm and Dry  ECG was not obtained today  Assessment and  Plan  Dysautonomia  PVCs  Nocturnal palpitations  Psychosocial stress/PTSD   We have reviewed the importance of salt and water repletion.  I encouraged her to continue to work on the psychological as can impact dysautonomic symptoms  We will use [employer] monitor to try to identify the mechanism of the nocturnal palpitations; these can either be secondary from cream associated sympathetic stress and/or primary arrhythmias.  We have reviewed the issues of heat intolerance  We spent more than 50% of our >25 min visit in face to face counseling regarding the above     Current medicines are reviewed at length with the patient today .  The patient does not have concerns regarding medicines.

## 2016-04-02 NOTE — Patient Instructions (Addendum)
Medication Instructions: - Your physician recommends that you continue on your current medications as directed. Please refer to the Current Medication list given to you today.  Labwork: - none ordered  Procedures/Testing: - none ordered  Follow-Up: - Your physician wants you to follow-up in: 4 months with Dr. Caryl Comes. You will receive a reminder letter in the mail two months in advance. If you don't receive a letter, please call our office to schedule the follow-up appointment.  Any Additional Special Instructions Will Be Listed Below (If Applicable).     If you need a refill on your cardiac medications before your next appointment, please call your pharmacy.

## 2016-04-24 ENCOUNTER — Encounter: Payer: Self-pay | Admitting: *Deleted

## 2016-04-24 ENCOUNTER — Ambulatory Visit
Admission: EM | Admit: 2016-04-24 | Discharge: 2016-04-24 | Disposition: A | Discharge status: Home / Self Care | Payer: Medicare Other | Attending: Family Medicine | Admitting: Family Medicine

## 2016-04-24 DIAGNOSIS — Z885 Allergy status to narcotic agent status: Secondary | ICD-10-CM | POA: Insufficient documentation

## 2016-04-24 DIAGNOSIS — Z79899 Other long term (current) drug therapy: Secondary | ICD-10-CM | POA: Diagnosis not present

## 2016-04-24 DIAGNOSIS — Z882 Allergy status to sulfonamides status: Secondary | ICD-10-CM | POA: Diagnosis not present

## 2016-04-24 DIAGNOSIS — Z881 Allergy status to other antibiotic agents status: Secondary | ICD-10-CM | POA: Diagnosis not present

## 2016-04-24 DIAGNOSIS — F4310 Post-traumatic stress disorder, unspecified: Secondary | ICD-10-CM | POA: Diagnosis not present

## 2016-04-24 DIAGNOSIS — J029 Acute pharyngitis, unspecified: Secondary | ICD-10-CM | POA: Diagnosis not present

## 2016-04-24 DIAGNOSIS — K219 Gastro-esophageal reflux disease without esophagitis: Secondary | ICD-10-CM | POA: Diagnosis not present

## 2016-04-24 DIAGNOSIS — Z87891 Personal history of nicotine dependence: Secondary | ICD-10-CM | POA: Diagnosis not present

## 2016-04-24 LAB — RAPID STREP SCREEN (MED CTR MEBANE ONLY): STREPTOCOCCUS, GROUP A SCREEN (DIRECT): NEGATIVE

## 2016-04-24 MED ORDER — NAPROXEN 500 MG PO TABS: 500.0000 mg | ORAL_TABLET | Freq: Two times a day (BID) | ORAL | 0 refills | Status: DC

## 2016-04-24 NOTE — ED Provider Notes (Signed)
CSN: [id number]     Arrival date & time 04/24/16  1840 History   First MD Initiated Contact with Patient 04/24/16 1906     Chief Complaint  Patient presents with  . Sore Throat   (Consider location/radiation/quality/duration/timing/severity/associated sxs/prior Treatment) HPI  This a 46 year old female who presents with sore throat she started having 2 days ago. She has a history of fever although her temperature is afebrile today. Had strep throat in the past and was concerned this may be the same. No recent sick contacts.      Past Medical History:  Diagnosis Date  . Anxiety   . GERD (gastroesophageal reflux disease)   . PTSD (post-traumatic stress disorder)   . Sinus tachycardia    Past Surgical History:  Procedure Laterality Date  . CESAREAN SECTION     NSD x 3 followed by C/S for 8 + pounds breech  . CHOLECYSTECTOMY    . LAPAROSCOPIC OOPHERECTOMY    . VAGINAL HYSTERECTOMY     had BTL, then VH, later developed ovarian mass and had ooph at 46 yo   Family History  Problem Relation Age of Onset  . Hypertension Father   . Breast cancer Maternal Aunt 30  . Breast cancer Paternal Aunt   . Breast cancer Paternal Aunt    Social History  Substance Use Topics  . Smoking status: Former Research scientist (life sciences)  . Smokeless tobacco: Never Used  . Alcohol use No   OB History    Gravida Para Term Preterm AB Living   4 4           SAB TAB Ectopic Multiple Live Births                 Review of Systems  Constitutional: Positive for activity change, chills and fever.  HENT: Positive for sore throat.   All other systems reviewed and are negative.   Allergies  Codeine; Sulfa antibiotics; Azithromycin; Ciprofloxacin; Doxycycline monohydrate; Erythromycin; and Doxycycline  Home Medications   Prior to Admission medications   Medication Sig Start Date End Date Taking? Authorizing Provider  acyclovir (ZOVIRAX) 400 MG tablet Take 400 mg by mouth as needed (AS NEEDED FOR COLD SORES).    Yes  Historical Provider, MD  ALPRAZolam Duanne Moron) 1 MG tablet Take 1 mg by mouth 4 (four) times daily as needed for anxiety.   Yes Historical Provider, MD  Ascorbic Acid (VITAMIN C) 1000 MG tablet Take 1,000 mg by mouth daily.   Yes Historical Provider, MD  Biotin 1 MG CAPS Take 1 capsule by mouth daily.   Yes Historical Provider, MD  Calcium Carb-Cholecalciferol (CALCIUM + D3) 600-200 MG-UNIT TABS Take 1 tablet by mouth 2 (two) times daily.    Yes Historical Provider, MD  conjugated estrogens (PREMARIN) vaginal cream Place 1 Applicatorful vaginally 2 (two) times a week.   Yes Historical Provider, MD  fluticasone (FLONASE) 50 MCG/ACT nasal spray Place 1 spray into both nostrils daily. For Eustachian Tube Dysfunction 05/25/15  Yes Historical Provider, MD  ibuprofen (ADVIL,MOTRIN) 200 MG tablet Take 800 mg by mouth as needed for fever, headache, mild pain, moderate pain or cramping.    Yes Historical Provider, MD  Magnesium 500 MG TABS Take 1 tablet by mouth daily as needed.    Yes Historical Provider, MD  omeprazole (PRILOSEC) 40 MG capsule Take 40 mg by mouth daily.   Yes Historical Provider, MD  acetaminophen (TYLENOL) 325 MG tablet Take 650 mg by mouth as needed for mild pain, moderate pain,  fever or headache.     Historical Provider, MD  cyclobenzaprine (FLEXERIL) 10 MG tablet Take 1 tablet by mouth at bedtime as needed for muscle spasms. Reported on 05/30/2015 05/25/15   Historical Provider, MD  FLUARIX QUADRIVALENT 0.5 ML injection Inject 0.5 mLs as directed once. 10/22/15   Historical Provider, MD  Garlic (GARLIQUE PO) Take by mouth daily.    Historical Provider, MD  naproxen (NAPROSYN) 500 MG tablet Take 1 tablet (500 mg total) by mouth 2 (two) times daily with a meal. 04/24/16   Lorin Picket, PA-C  potassium gluconate 595 (99 K) MG TABS tablet Take 595 mg by mouth 3 (three) times a week.    Historical Provider, MD  valACYclovir (VALTREX) 1000 MG tablet Take 2 tablets by mouth every 12 (twelve) hours  as needed (AS NEEDED FOR COLD SORES OR SHINGLES OUTBREAK.).  05/10/15   Historical Provider, MD  vitamin B-12 (CYANOCOBALAMIN) 1000 MCG tablet Take 1,000 mcg by mouth as needed.     Historical Provider, MD   Meds Ordered and Administered this Visit  Medications - No data to display  BP (!) 125/58 (BP Location: Left Arm)   Pulse 80   Temp 98 F (36.7 C) (Oral)   Resp 16   Ht 5\' 5"  (1.651 m)   Wt 165 lb (74.8 kg)   SpO2 100%   BMI 27.46 kg/m  No data found.   Physical Exam  Constitutional: She is oriented to person, place, and time. She appears well-developed and well-nourished. No distress.  HENT:  Head: Normocephalic and atraumatic.  Right Ear: External ear normal.  Left Ear: External ear normal.  Mouth/Throat: Oropharynx is clear and moist.  She has cobblestone tonsils with tonsilliths present particularly on the left. There is no cervical adenopathy present.  Eyes: Pupils are equal, round, and reactive to light. Right eye exhibits no discharge. Left eye exhibits no discharge.  Neck: Normal range of motion. Neck supple.  Pulmonary/Chest: Effort normal and breath sounds normal.  Musculoskeletal: Normal range of motion.  Lymphadenopathy:    She has no cervical adenopathy.  Neurological: She is alert and oriented to person, place, and time.  Skin: Skin is warm and dry. She is not diaphoretic.  Psychiatric: She has a normal mood and affect. Her behavior is normal. Judgment and thought content normal.  Nursing note and vitals reviewed.   Urgent Care Course     Procedures (including critical care time)  Labs Review Labs Reviewed  RAPID STREP SCREEN (NOT AT [facility])  CULTURE, GROUP A STREP [facility])    Imaging Review No results found.   Visual Acuity Review  Right Eye Distance:   Left Eye Distance:   Bilateral Distance:    Right Eye Near:   Left Eye Near:    Bilateral Near:         MDM   1. Sore throat    Discharge Medication List as of 04/24/2016  7:36 PM     START taking these medications   Details  naproxen (NAPROSYN) 500 MG tablet Take 1 tablet (500 mg total) by mouth 2 (two) times daily with a meal., Starting Fri 04/24/2016, Normal      Plan: 1. Test/x-ray results and diagnosis reviewed with patient 2. rx as per orders; risks, benefits, potential side effects reviewed with patient 3. Recommend supportive treatment with Salt water gargles. Use Naprosyn for inflammation. Tears of the pharyngeal swab will be available in 48 hours. Told patient this is most likely a viral  illness and will not respond to antibiotics unless the cultures show that he has strep. Not improving she should follow-up with her primary care physician.  4. F/u prn if symptoms worsen or don't improve     Lorin Picket, PA-C 04/24/16 [place] Lynwood [place], [place] 04/24/16 1946

## 2016-04-24 NOTE — ED Triage Notes (Signed)
Patient started having symptoms of sore throat 2 days ago. Additional symptom of fever and cold sore are present.

## 2016-04-27 ENCOUNTER — Telehealth: Payer: Self-pay

## 2016-04-27 LAB — CULTURE, GROUP A STREP (THRC)

## 2016-04-27 NOTE — Telephone Encounter (Signed)
Pt called for Strep Culture results, pt was informed that it was negative. She is still having some symptoms I advised her to be re-seen here or follow up with her PCP soon.

## 2016-04-29 DIAGNOSIS — J019 Acute sinusitis, unspecified: Secondary | ICD-10-CM | POA: Diagnosis not present

## 2016-06-16 DIAGNOSIS — Z79899 Other long term (current) drug therapy: Secondary | ICD-10-CM | POA: Diagnosis not present

## 2016-06-16 DIAGNOSIS — F410 Panic disorder [episodic paroxysmal anxiety] without agoraphobia: Secondary | ICD-10-CM | POA: Diagnosis not present

## 2016-08-20 ENCOUNTER — Encounter: Payer: Self-pay | Admitting: Internal Medicine

## 2016-08-20 ENCOUNTER — Ambulatory Visit (INDEPENDENT_AMBULATORY_CARE_PROVIDER_SITE_OTHER): Payer: Medicare Other | Admitting: Internal Medicine

## 2016-08-20 VITALS — BP 111/76 | HR 82 | Ht 65.0 in | Wt 172.0 lb

## 2016-08-20 DIAGNOSIS — G909 Disorder of the autonomic nervous system, unspecified: Secondary | ICD-10-CM

## 2016-08-20 DIAGNOSIS — I493 Ventricular premature depolarization: Secondary | ICD-10-CM

## 2016-08-20 DIAGNOSIS — F329 Major depressive disorder, single episode, unspecified: Secondary | ICD-10-CM | POA: Diagnosis not present

## 2016-08-20 DIAGNOSIS — G901 Familial dysautonomia [Riley-Day]: Secondary | ICD-10-CM

## 2016-08-20 NOTE — Progress Notes (Signed)
Patient Care Team: Ardelle Lesches, MD as PCP - General (Internal Medicine)   HPI  Ariel Bryant is a 46 y.o. female Seen in follow-up for symptoms consistent with dysautonomia. She's had significant GI symptoms and she was referred to [facility].  At the last visit we encouraged exercise salt supplementation and consideration of addressing the psychosocial aspects related to her PTSD  She also has symptomatic PVCs.  We reviewed her symptoms again. She has 3 symptom complexes including flutters which correlate by the event recorder with PVCs. She has nocturnal symptoms that awaken her from sleep lasting 2-3 minutes. We have no arrhythmia correlation of these. Mid she has palpitations echo on through the day. These seem to be related possibly to the PVCs. They are aggravated by stress.  She brings in an [place] monitor. With some of her palpitations. They are all associated with normal sinus rhythm.  Her father died recently. This is continued to be a major struggle. She has difficulty with initiating activity sometimes including getting out of bed, she is not eating or cooking at home. She saw her psychiatrist who suggested this was all normal grieving.  Reviewed the notes from [place]. Not particularly enlightening unfortunately.  Records and Results Reviewed. As above   Past Medical History:  Diagnosis Date  . Anxiety   . GERD (gastroesophageal reflux disease)   . PTSD (post-traumatic stress disorder)   . Sinus tachycardia     Past Surgical History:  Procedure Laterality Date  . CESAREAN SECTION     NSD x 3 followed by C/S for 8 + pounds breech  . CHOLECYSTECTOMY    . LAPAROSCOPIC OOPHERECTOMY    . VAGINAL HYSTERECTOMY     had BTL, then VH, later developed ovarian mass and had ooph at 46 yo    Current Outpatient Prescriptions  Medication Sig Dispense Refill  . acetaminophen (TYLENOL) 325 MG tablet Take 650 mg by mouth as needed for mild pain, moderate pain, fever or  headache.     Marland Kitchen acyclovir (ZOVIRAX) 400 MG tablet Take 400 mg by mouth as needed (AS NEEDED FOR COLD SORES).     Marland Kitchen alprazolam (XANAX) 2 MG tablet Take 2 mg by mouth 3 (three) times daily.  3  . Ascorbic Acid (VITAMIN C) 1000 MG tablet Take 1,000 mg by mouth daily.    . Biotin 1 MG CAPS Take 1 capsule by mouth daily.    . Calcium Carb-Cholecalciferol (CALCIUM + D3) 600-200 MG-UNIT TABS Take 1 tablet by mouth 2 (two) times daily.     Marland Kitchen conjugated estrogens (PREMARIN) vaginal cream Place 1 Applicatorful vaginally 2 (two) times a week.    . cyclobenzaprine (FLEXERIL) 10 MG tablet Take 1 tablet by mouth at bedtime as needed for muscle spasms. Reported on 05/30/2015    . FLUARIX QUADRIVALENT 0.5 ML injection Inject 0.5 mLs as directed once.  0  . fluticasone (FLONASE) 50 MCG/ACT nasal spray Place 1 spray into both nostrils daily. For Eustachian Tube Dysfunction    . ibuprofen (ADVIL,MOTRIN) 200 MG tablet Take 800 mg by mouth as needed for fever, headache, mild pain, moderate pain or cramping.     Marland Kitchen ipratropium (ATROVENT HFA) 17 MCG/ACT inhaler Inhale 2 puffs into the lungs every 4 (four) hours as needed for wheezing.    . Magnesium 500 MG TABS Take 1 tablet by mouth daily as needed.     Marland Kitchen omeprazole (PRILOSEC) 40 MG capsule Take 40 mg by mouth daily.    Marland Kitchen  potassium gluconate 595 (99 K) MG TABS tablet Take 595 mg by mouth 3 (three) times a week.    . valACYclovir (VALTREX) 1000 MG tablet Take 2 tablets by mouth every 12 (twelve) hours as needed (AS NEEDED FOR COLD SORES OR SHINGLES OUTBREAK.).   6  . vitamin B-12 (CYANOCOBALAMIN) 1000 MCG tablet Take 1,000 mcg by mouth as needed.      No current facility-administered medications for this visit.     Allergies  Allergen Reactions  . Codeine Shortness Of Breath  . Sulfa Antibiotics Anaphylaxis  . Azithromycin Other (See Comments)  . Ciprofloxacin Other (See Comments)  . Doxycycline Monohydrate Other (See Comments)  . Erythromycin Other (See  Comments)  . Doxycycline Rash      Review of Systems negative except from HPI and PMH  Physical Exam BP 111/76 (BP Location: Left Arm, Patient Position: Sitting, Cuff Size: Normal)   Pulse 82   Ht 5\' 5"  (1.651 m)   Wt 172 lb (78 kg)   BMI 28.62 kg/m  Well developed and nourished in no acute distress HENT normal Neck supple with JVP-flat Carotids brisk and full without bruits Clear Regular rate and rhythm, no murmurs or gallops Abd-soft with active BS without hepatomegaly No Clubbing cyanosis edema Skin-warm and dry A & Oriented  Grossly normal sensory and motor function   ECG demonstrates sinus rhythm at 82 with intervals 14/07/40  Assessment and  Plan  Dysautonomia  PVCs  Nocturnal palpitations  Psychosocial stress/PTSD  Grief/ reactive depression   Her palpitations at night have not recurred. She will continue to use [employer] monitor.  No PVCs and detected with recurrent symptoms.  We discussed her reaction to her dad. I suggested that she may be struggle with reactive depression and she might benefit from short-term antidepressant therapy. She will follow-up with her PCP.  We spent more than 50% of our >25 min visit in face to face counseling regarding the above        Current medicines are reviewed at length with the patient today .  The patient does not have concerns regarding medicines.

## 2016-08-20 NOTE — Patient Instructions (Signed)

## 2016-09-04 DIAGNOSIS — F4320 Adjustment disorder, unspecified: Secondary | ICD-10-CM | POA: Diagnosis not present

## 2016-09-04 DIAGNOSIS — F418 Other specified anxiety disorders: Secondary | ICD-10-CM | POA: Diagnosis not present

## 2016-09-04 DIAGNOSIS — Z1389 Encounter for screening for other disorder: Secondary | ICD-10-CM | POA: Diagnosis not present

## 2016-09-24 DIAGNOSIS — Z0000 Encounter for general adult medical examination without abnormal findings: Secondary | ICD-10-CM | POA: Diagnosis not present

## 2016-09-24 DIAGNOSIS — F4320 Adjustment disorder, unspecified: Secondary | ICD-10-CM | POA: Diagnosis not present

## 2016-09-24 DIAGNOSIS — E781 Pure hyperglyceridemia: Secondary | ICD-10-CM | POA: Diagnosis not present

## 2016-09-24 DIAGNOSIS — R7309 Other abnormal glucose: Secondary | ICD-10-CM | POA: Diagnosis not present

## 2016-09-24 DIAGNOSIS — G901 Familial dysautonomia [Riley-Day]: Secondary | ICD-10-CM | POA: Diagnosis not present

## 2016-09-24 DIAGNOSIS — K219 Gastro-esophageal reflux disease without esophagitis: Secondary | ICD-10-CM | POA: Diagnosis not present

## 2016-09-24 DIAGNOSIS — Z1389 Encounter for screening for other disorder: Secondary | ICD-10-CM | POA: Diagnosis not present

## 2016-09-24 DIAGNOSIS — J4530 Mild persistent asthma, uncomplicated: Secondary | ICD-10-CM | POA: Diagnosis not present

## 2016-09-24 DIAGNOSIS — F418 Other specified anxiety disorders: Secondary | ICD-10-CM | POA: Diagnosis not present

## 2016-09-25 ENCOUNTER — Other Ambulatory Visit: Payer: Self-pay | Admitting: Family Medicine

## 2016-09-25 DIAGNOSIS — R7309 Other abnormal glucose: Secondary | ICD-10-CM | POA: Diagnosis not present

## 2016-09-25 DIAGNOSIS — E781 Pure hyperglyceridemia: Secondary | ICD-10-CM | POA: Diagnosis not present

## 2016-09-25 DIAGNOSIS — Z1231 Encounter for screening mammogram for malignant neoplasm of breast: Secondary | ICD-10-CM

## 2016-10-14 DIAGNOSIS — J069 Acute upper respiratory infection, unspecified: Secondary | ICD-10-CM | POA: Diagnosis not present

## 2016-10-14 DIAGNOSIS — G44209 Tension-type headache, unspecified, not intractable: Secondary | ICD-10-CM | POA: Diagnosis not present

## 2016-10-22 DIAGNOSIS — Z23 Encounter for immunization: Secondary | ICD-10-CM | POA: Diagnosis not present

## 2016-12-04 ENCOUNTER — Ambulatory Visit
Admission: RE | Admit: 2016-12-04 | Discharge: 2016-12-04 | Disposition: A | Discharge status: Home / Self Care | Payer: Medicare Other | Source: Ambulatory Visit | Attending: Family Medicine | Admitting: Family Medicine

## 2016-12-04 DIAGNOSIS — Z1231 Encounter for screening mammogram for malignant neoplasm of breast: Secondary | ICD-10-CM

## 2016-12-07 ENCOUNTER — Other Ambulatory Visit: Payer: Self-pay | Admitting: Family Medicine

## 2016-12-07 DIAGNOSIS — R928 Other abnormal and inconclusive findings on diagnostic imaging of breast: Secondary | ICD-10-CM

## 2016-12-17 ENCOUNTER — Ambulatory Visit
Admission: RE | Admit: 2016-12-17 | Discharge: 2016-12-17 | Disposition: A | Discharge status: Home / Self Care | Payer: Medicare Other | Source: Ambulatory Visit | Attending: Family Medicine | Admitting: Family Medicine

## 2016-12-17 DIAGNOSIS — N6489 Other specified disorders of breast: Secondary | ICD-10-CM | POA: Insufficient documentation

## 2016-12-17 DIAGNOSIS — R928 Other abnormal and inconclusive findings on diagnostic imaging of breast: Secondary | ICD-10-CM

## 2016-12-21 ENCOUNTER — Other Ambulatory Visit: Payer: Self-pay | Admitting: Family Medicine

## 2016-12-21 DIAGNOSIS — R928 Other abnormal and inconclusive findings on diagnostic imaging of breast: Secondary | ICD-10-CM

## 2016-12-21 DIAGNOSIS — E781 Pure hyperglyceridemia: Secondary | ICD-10-CM | POA: Diagnosis not present

## 2016-12-21 DIAGNOSIS — F418 Other specified anxiety disorders: Secondary | ICD-10-CM | POA: Diagnosis not present

## 2016-12-28 ENCOUNTER — Ambulatory Visit
Admission: RE | Admit: 2016-12-28 | Discharge: 2016-12-28 | Disposition: A | Discharge status: Home / Self Care | Payer: Medicare Other | Source: Ambulatory Visit | Attending: Family Medicine | Admitting: Family Medicine

## 2016-12-28 DIAGNOSIS — N6012 Diffuse cystic mastopathy of left breast: Secondary | ICD-10-CM | POA: Diagnosis not present

## 2016-12-28 DIAGNOSIS — R921 Mammographic calcification found on diagnostic imaging of breast: Secondary | ICD-10-CM | POA: Diagnosis not present

## 2016-12-28 DIAGNOSIS — N6489 Other specified disorders of breast: Secondary | ICD-10-CM | POA: Diagnosis not present

## 2016-12-28 DIAGNOSIS — R928 Other abnormal and inconclusive findings on diagnostic imaging of breast: Secondary | ICD-10-CM

## 2016-12-28 HISTORY — PX: BREAST BIOPSY: SHX20

## 2016-12-29 LAB — SURGICAL PATHOLOGY

## 2017-01-04 ENCOUNTER — Encounter: Payer: Self-pay | Admitting: *Deleted

## 2017-01-04 NOTE — Progress Notes (Signed)
  Oncology Nurse Navigator Documentation  Navigator Location: [facility] (01/04/17 1600)   )Navigator Encounter Type: Telephone (01/04/17 1600)                             Interventions: Coordination of Care (01/04/17 1600)   Coordination of Care: Appts (01/04/17 1600)                  Time Spent with Ariel Bryant: 15 (01/04/17 1600)   Received call from [place] from the breast center on Friday.  Ariel Bryant states Ariel Bryant needed referral for surgical consult.  Reviewed addendum from radiologist and path and mammo are discordant.  Called Ariel Bryant and she would like to see Ariel Bryant.  She is scheduled to see him on 01/13/17 @ 11:30.

## 2017-01-13 ENCOUNTER — Encounter: Payer: Self-pay | Admitting: General Surgery

## 2017-01-13 ENCOUNTER — Ambulatory Visit (INDEPENDENT_AMBULATORY_CARE_PROVIDER_SITE_OTHER): Payer: Medicare Other | Admitting: General Surgery

## 2017-01-13 VITALS — BP 128/74 | HR 76 | Resp 12 | Ht 64.0 in | Wt 172.0 lb

## 2017-01-13 DIAGNOSIS — N6321 Unspecified lump in the left breast, upper outer quadrant: Secondary | ICD-10-CM | POA: Diagnosis not present

## 2017-01-13 NOTE — Progress Notes (Signed)
Patient ID: Ariel Bryant, female   DOB: Oct 11, 1970, 46 y.o.   MRN: [id number]  Chief Complaint  Patient presents with  . Other    HPI Ariel Bryant is a 46 y.o. female who presents for a breast evaluation. The most recent mammogram was done on 12/04/2016 and added views on 12/17/2016 left breast biospy on 12/28/2016. Patient does perform regular self breast checks and gets regular mammograms done.   Patient stopped using premarin cream 4 postmenopausal vaginal dryness in 2017 on the recommendation of her cardiologist.  Husband, Heath Lark is present at visit.   HPI  Past Medical History:  Diagnosis Date  . Anxiety   . GERD (gastroesophageal reflux disease)   . PTSD (post-traumatic stress disorder)   . Sinus tachycardia     Past Surgical History:  Procedure Laterality Date  . BREAST BIOPSY Left 12/28/2016   Affirm Biopsy- path pending. RIbbon clip  . CESAREAN SECTION     NSD x 3 followed by C/S for 8 + pounds breech  . CHOLECYSTECTOMY    . LAPAROSCOPIC OOPHERECTOMY    . VAGINAL HYSTERECTOMY     had BTL, then VH, later developed ovarian mass and had ooph at 46 yo    Family History  Problem Relation Age of Onset  . Hypertension Father   . Breast cancer Maternal Aunt 30  . Breast cancer Paternal Aunt   . Breast cancer Paternal Aunt     Social History Social History   Tobacco Use  . Smoking status: Former Research scientist (life sciences)  . Smokeless tobacco: Never Used  Substance Use Topics  . Alcohol use: No  . Drug use: No    Allergies  Allergen Reactions  . Codeine Shortness Of Breath  . Sulfa Antibiotics Anaphylaxis  . Azithromycin Other (See Comments)  . Ciprofloxacin Other (See Comments)  . Doxycycline Monohydrate Other (See Comments)  . Erythromycin Other (See Comments)  . Doxycycline Rash    Current Outpatient Medications  Medication Sig Dispense Refill  . acetaminophen (TYLENOL) 325 MG tablet Take 650 mg by mouth as needed for mild pain, moderate pain, fever or  headache.     Marland Kitchen acyclovir (ZOVIRAX) 400 MG tablet Take 400 mg by mouth as needed (AS NEEDED FOR COLD SORES).     Marland Kitchen alprazolam (XANAX) 2 MG tablet Take 2 mg by mouth 3 (three) times daily.  3  . Ascorbic Acid (VITAMIN C) 1000 MG tablet Take 1,000 mg by mouth daily.    . Biotin 1 MG CAPS Take 1 capsule by mouth daily.    . Calcium Carb-Cholecalciferol (CALCIUM + D3) 600-200 MG-UNIT TABS Take 1 tablet by mouth 2 (two) times daily.     . cyclobenzaprine (FLEXERIL) 10 MG tablet Take 1 tablet by mouth at bedtime as needed for muscle spasms. Reported on 05/30/2015    . fluticasone (FLONASE) 50 MCG/ACT nasal spray Place 1 spray into both nostrils daily. For Eustachian Tube Dysfunction    . ibuprofen (ADVIL,MOTRIN) 200 MG tablet Take 800 mg by mouth as needed for fever, headache, mild pain, moderate pain or cramping.     Marland Kitchen ipratropium (ATROVENT HFA) 17 MCG/ACT inhaler Inhale 2 puffs into the lungs every 4 (four) hours as needed for wheezing.    . Magnesium 500 MG TABS Take 1 tablet by mouth daily as needed.     Marland Kitchen omeprazole (PRILOSEC) 40 MG capsule Take 40 mg by mouth daily.    . potassium gluconate 595 (99 K) MG TABS tablet Take 595 mg  by mouth 3 (three) times a week.    . valACYclovir (VALTREX) 1000 MG tablet Take 2 tablets by mouth every 12 (twelve) hours as needed (AS NEEDED FOR COLD SORES OR SHINGLES OUTBREAK.).   6  . vitamin B-12 (CYANOCOBALAMIN) 1000 MCG tablet Take 1,000 mcg by mouth as needed.     . conjugated estrogens (PREMARIN) vaginal cream Place 1 Applicatorful vaginally 2 (two) times a week.    Marland Kitchen FLUARIX QUADRIVALENT 0.5 ML injection Inject 0.5 mLs as directed once.  0   No current facility-administered medications for this visit.     Review of Systems Review of Systems  Blood pressure 128/74, pulse 76, resp. rate 12, height 5\' 4"  (1.626 m), weight 172 lb (78 kg).  Physical Exam Physical Exam  Constitutional: She is oriented to person, place, and time. She appears well-developed and  well-nourished.  Eyes: Conjunctivae are normal. No scleral icterus.  Neck: Neck supple.  Cardiovascular: Normal rate, regular rhythm and normal heart sounds.  Pulmonary/Chest: Effort normal and breath sounds normal. Right breast exhibits no inverted nipple, no mass, no nipple discharge, no skin change and no tenderness. Left breast exhibits no inverted nipple, no mass, no nipple discharge, no skin change and no tenderness.  Lymphadenopathy:    She has no cervical adenopathy.    She has no axillary adenopathy.  Neurological: She is alert and oriented to person, place, and time.  Skin: Skin is warm and dry.    Data Reviewed Screening mammograms of 12/04/2015 in 12/04/2016 as well as diagnostic mammograms and ultrasound of 12/17/2016 and post biopsy mammograms of 12/28/2016 were reviewed.  Addendum to the postbiopsy clip or suture of 01/01/2017 reported a discordance between the mammographic interpretation of the pathology. Surgical excision was recommended.  The postbiopsy clip and cavity is exactly over the area of mammographic abnormalitythat prompted biopsy.  Biopsy results from 12/28/2016: DIAGNOSIS:  A. LEFT LATERAL BREAST; STEREOTACTIC BIOPSY:  - FOCAL FIBROCYSTIC CHANGE.  - NEGATIVE FOR ATYPIA AND MALIGNANCY.  Pathology shows that approximately 0.3 cm of tissue was removed.  Assessment    Discordance between mammogram and biopsy, biopsy normal.    Plan    The patient reports that when initially presented with the mammographic abnormality she brought up the issue of observation. From her description she was pushed the biopsy.  With the benign findings the options for observation versus surgical excision were discussed. The likelihood of a missed malignancy is felt to be low with the modest interval change between 2017 2018 as well as the fact that the biopsy site is exactly overlying the mammographic abnormality.  At this time, the patient is desirous of a 6 month follow-up.  If there is interval change in the site over the next 6-12 months formal surgical excision would be very appropriate.   Patient to return in six months left breast diagnotic mammogram. The patient is aware to call back for any questions or concerns.  Should she develop any anxiety regarding the discordance between mammogram and biopsy between now and her scheduled follow-up she was encouraged to call for early reassessment.  We did discuss that there is modest systemic absorption of vaginal estrogen, usually less than 30% of the same dose of oral agents. If her vaginal discomfort or dyspareunia is not relieved with nonhormonal mechanisms, she should have a frank discussion th her cardiolog who prompted her to discontinue the medication about her real cardiac risk rom ongoing vaginal estrogen use.  HPI, Physical Exam, Assessment and Plan  have been scribed under the direction and in the presence of Hervey Ard, MD.  Gaspar Cola, CMA  I have completed the exam and reviewed the above documentation for accuracy and completeness.  I agree with the above.  Haematologist has been used and any errors in dictation or transcription are unintentional.  Hervey Ard, M.D., F.A.C.S.  Robert Bellow 01/14/2017, 8:42 PM

## 2017-01-13 NOTE — Patient Instructions (Signed)
Discussed options with patient to do a mammogram in six months or biopsy. Patient to return in six months left breast diagnotic mammogram. The patient is aware to call back for any questions or concerns.

## 2017-01-14 DIAGNOSIS — N6321 Unspecified lump in the left breast, upper outer quadrant: Secondary | ICD-10-CM | POA: Insufficient documentation

## 2017-01-22 DIAGNOSIS — F419 Anxiety disorder, unspecified: Secondary | ICD-10-CM | POA: Diagnosis not present

## 2017-01-22 DIAGNOSIS — Z79899 Other long term (current) drug therapy: Secondary | ICD-10-CM | POA: Diagnosis not present

## 2017-02-04 ENCOUNTER — Telehealth: Payer: Self-pay | Admitting: *Deleted

## 2017-02-04 NOTE — Telephone Encounter (Signed)
She called to say she had noticed some occasional sharp pains int he left breast (post bx in Nov). She will try a heating pad and Advil taking on a consistent basis to see if that helps. She will monitor and send mychart message or call back for appointment if it doesn't settle down. Denies change in activity or caffeine intake.

## 2017-02-11 DIAGNOSIS — L739 Follicular disorder, unspecified: Secondary | ICD-10-CM | POA: Diagnosis not present

## 2017-02-11 DIAGNOSIS — L210 Seborrhea capitis: Secondary | ICD-10-CM | POA: Diagnosis not present

## 2017-03-09 DIAGNOSIS — F320 Major depressive disorder, single episode, mild: Secondary | ICD-10-CM | POA: Diagnosis not present

## 2017-03-09 DIAGNOSIS — F410 Panic disorder [episodic paroxysmal anxiety] without agoraphobia: Secondary | ICD-10-CM | POA: Diagnosis not present

## 2017-03-09 DIAGNOSIS — Z79899 Other long term (current) drug therapy: Secondary | ICD-10-CM | POA: Diagnosis not present

## 2017-04-20 ENCOUNTER — Inpatient Hospital Stay (HOSPITAL_COMMUNITY)
Admit: 2017-04-20 | Discharge: 2017-04-20 | Disposition: A | Discharge status: Home / Self Care | Payer: Medicare Other | Attending: Family Medicine | Admitting: Family Medicine

## 2017-04-20 ENCOUNTER — Emergency Department: Payer: Medicare Other

## 2017-04-20 ENCOUNTER — Encounter: Payer: Self-pay | Admitting: Emergency Medicine

## 2017-04-20 ENCOUNTER — Other Ambulatory Visit: Payer: Self-pay

## 2017-04-20 ENCOUNTER — Inpatient Hospital Stay: Payer: Medicare Other

## 2017-04-20 ENCOUNTER — Inpatient Hospital Stay
Admission: EM | Admit: 2017-04-20 | Discharge: 2017-04-21 | DRG: 392 | Disposition: A | Discharge status: Home / Self Care | Payer: Medicare Other | Attending: Internal Medicine | Admitting: Internal Medicine

## 2017-04-20 DIAGNOSIS — E041 Nontoxic single thyroid nodule: Secondary | ICD-10-CM

## 2017-04-20 DIAGNOSIS — E876 Hypokalemia: Secondary | ICD-10-CM | POA: Diagnosis present

## 2017-04-20 DIAGNOSIS — Z882 Allergy status to sulfonamides status: Secondary | ICD-10-CM | POA: Diagnosis not present

## 2017-04-20 DIAGNOSIS — Z79899 Other long term (current) drug therapy: Secondary | ICD-10-CM | POA: Diagnosis not present

## 2017-04-20 DIAGNOSIS — E878 Other disorders of electrolyte and fluid balance, not elsewhere classified: Secondary | ICD-10-CM | POA: Diagnosis present

## 2017-04-20 DIAGNOSIS — I469 Cardiac arrest, cause unspecified: Secondary | ICD-10-CM | POA: Diagnosis not present

## 2017-04-20 DIAGNOSIS — R1013 Epigastric pain: Secondary | ICD-10-CM

## 2017-04-20 DIAGNOSIS — R079 Chest pain, unspecified: Secondary | ICD-10-CM | POA: Diagnosis not present

## 2017-04-20 DIAGNOSIS — F4310 Post-traumatic stress disorder, unspecified: Secondary | ICD-10-CM | POA: Diagnosis present

## 2017-04-20 DIAGNOSIS — R000 Tachycardia, unspecified: Secondary | ICD-10-CM | POA: Diagnosis not present

## 2017-04-20 DIAGNOSIS — Z87891 Personal history of nicotine dependence: Secondary | ICD-10-CM | POA: Diagnosis not present

## 2017-04-20 DIAGNOSIS — K529 Noninfective gastroenteritis and colitis, unspecified: Secondary | ICD-10-CM

## 2017-04-20 DIAGNOSIS — R55 Syncope and collapse: Secondary | ICD-10-CM

## 2017-04-20 DIAGNOSIS — Z90710 Acquired absence of both cervix and uterus: Secondary | ICD-10-CM

## 2017-04-20 DIAGNOSIS — A09 Infectious gastroenteritis and colitis, unspecified: Secondary | ICD-10-CM | POA: Diagnosis present

## 2017-04-20 DIAGNOSIS — Z885 Allergy status to narcotic agent status: Secondary | ICD-10-CM | POA: Diagnosis not present

## 2017-04-20 DIAGNOSIS — Z7951 Long term (current) use of inhaled steroids: Secondary | ICD-10-CM

## 2017-04-20 DIAGNOSIS — Z881 Allergy status to other antibiotic agents status: Secondary | ICD-10-CM

## 2017-04-20 DIAGNOSIS — I6523 Occlusion and stenosis of bilateral carotid arteries: Secondary | ICD-10-CM | POA: Diagnosis not present

## 2017-04-20 DIAGNOSIS — R140 Abdominal distension (gaseous): Secondary | ICD-10-CM | POA: Diagnosis not present

## 2017-04-20 DIAGNOSIS — K219 Gastro-esophageal reflux disease without esophagitis: Secondary | ICD-10-CM | POA: Diagnosis present

## 2017-04-20 DIAGNOSIS — E871 Hypo-osmolality and hyponatremia: Secondary | ICD-10-CM | POA: Diagnosis present

## 2017-04-20 LAB — COMPREHENSIVE METABOLIC PANEL
ALBUMIN: 4.5 g/dL (ref 3.5–5.0)
ALT: 22 U/L (ref 14–54)
ANION GAP: 15 (ref 5–15)
AST: 28 U/L (ref 15–41)
Alkaline Phosphatase: 64 U/L (ref 38–126)
BUN: 10 mg/dL (ref 6–20)
CO2: 19 mmol/L — AB (ref 22–32)
Calcium: 9.4 mg/dL (ref 8.9–10.3)
Chloride: 100 mmol/L — ABNORMAL LOW (ref 101–111)
Creatinine, Ser: 0.7 mg/dL (ref 0.44–1.00)
GFR calc Af Amer: >60 mL/min (ref >60)
GFR calc non Af Amer: >60 mL/min (ref >60)
GLUCOSE: 134 mg/dL — AB (ref 65–99)
Potassium: 3.1 mmol/L — ABNORMAL LOW (ref 3.5–5.1)
SODIUM: 134 mmol/L — AB (ref 135–145)
TOTAL PROTEIN: 8.1 g/dL (ref 6.5–8.1)
Total Bilirubin: 0.6 mg/dL (ref 0.3–1.2)

## 2017-04-20 LAB — URINALYSIS, COMPLETE (UACMP) WITH MICROSCOPIC
BACTERIA UA: NONE SEEN
Bilirubin Urine: NEGATIVE
Glucose, UA: NEGATIVE mg/dL
HGB URINE DIPSTICK: NEGATIVE
KETONES UR: 5 mg/dL — AB
LEUKOCYTES UA: NEGATIVE
NITRITE: NEGATIVE
PH: 8 (ref 5.0–8.0)
Protein, ur: NEGATIVE mg/dL
SQUAMOUS EPITHELIAL / LPF: NONE SEEN
Specific Gravity, Urine: 1.023 (ref 1.005–1.030)

## 2017-04-20 LAB — LIPASE, BLOOD: Lipase: 24 U/L (ref 11–51)

## 2017-04-20 LAB — CBC WITH DIFFERENTIAL/PLATELET
Basophils Absolute: 0.1 10*3/uL (ref 0–0.1)
Basophils Relative: 0 %
EOS ABS: 0.1 10*3/uL (ref 0–0.7)
EOS PCT: 1 %
HEMATOCRIT: 43.5 % (ref 35.0–47.0)
HEMOGLOBIN: 14.3 g/dL (ref 12.0–16.0)
LYMPHS ABS: 2.5 10*3/uL (ref 1.0–3.6)
Lymphocytes Relative: 16 %
MCH: 27.8 pg (ref 26.0–34.0)
MCHC: 32.9 g/dL (ref 32.0–36.0)
MCV: 84.4 fL (ref 80.0–100.0)
MONO ABS: 0.9 10*3/uL (ref 0.2–0.9)
Monocytes Relative: 6 %
Neutro Abs: 11.8 10*3/uL — ABNORMAL HIGH (ref 1.4–6.5)
Neutrophils Relative %: 77 %
Platelets: 365 10*3/uL (ref 150–440)
RBC: 5.16 MIL/uL (ref 3.80–5.20)
RDW: 12.9 % (ref 11.5–14.5)
WBC: 15.4 10*3/uL — AB (ref 3.6–11.0)

## 2017-04-20 LAB — CREATININE, SERUM
Creatinine, Ser: 0.66 mg/dL (ref 0.44–1.00)
GFR calc non Af Amer: >60 mL/min (ref >60)

## 2017-04-20 LAB — TROPONIN I
Troponin I: <0.03 ng/mL (ref <0.03)
Troponin I: <0.03 ng/mL (ref <0.03)
Troponin I: <0.03 ng/mL (ref <0.03)

## 2017-04-20 LAB — LACTIC ACID, PLASMA
Lactic Acid, Venous: 1.6 mmol/L (ref 0.5–1.9)
Lactic Acid, Venous: 1.4 mmol/L (ref 0.5–1.9)

## 2017-04-20 LAB — CBC
HEMATOCRIT: 38.5 % (ref 35.0–47.0)
Hemoglobin: 12.9 g/dL (ref 12.0–16.0)
MCH: 28.6 pg (ref 26.0–34.0)
MCHC: 33.5 g/dL (ref 32.0–36.0)
MCV: 85.3 fL (ref 80.0–100.0)
Platelets: 222 10*3/uL (ref 150–440)
RBC: 4.52 MIL/uL (ref 3.80–5.20)
RDW: 12.9 % (ref 11.5–14.5)
WBC: 10.2 10*3/uL (ref 3.6–11.0)

## 2017-04-20 LAB — PHOSPHORUS: Phosphorus: 2.9 mg/dL (ref 2.5–4.6)

## 2017-04-20 LAB — ECHOCARDIOGRAM COMPLETE
HEIGHTINCHES: 64 in
Weight: 2697.6 oz

## 2017-04-20 LAB — GLUCOSE, CAPILLARY: Glucose-Capillary: 92 mg/dL (ref 65–99)

## 2017-04-20 LAB — TSH: TSH: 0.584 u[IU]/mL (ref 0.350–4.500)

## 2017-04-20 LAB — MAGNESIUM: MAGNESIUM: 2.3 mg/dL (ref 1.7–2.4)

## 2017-04-20 MED ORDER — METRONIDAZOLE 500 MG PO TABS
500.0000 mg | ORAL_TABLET | Freq: Three times a day (TID) | ORAL | Status: DC
  Administered 2017-04-20 (×2): 500 mg via ORAL
  Filled 2017-04-20 (×3): qty 1

## 2017-04-20 MED ORDER — IOPAMIDOL (ISOVUE-300) INJECTION 61%
100.0000 mL | Freq: Once | INTRAVENOUS | Status: AC | PRN
  Administered 2017-04-20: 100 mL via INTRAVENOUS

## 2017-04-20 MED ORDER — ACETAMINOPHEN 325 MG PO TABS
650.0000 mg | ORAL_TABLET | Freq: Four times a day (QID) | ORAL | Status: DC | PRN
  Administered 2017-04-20 – 2017-04-21 (×3): 650 mg via ORAL
  Filled 2017-04-20 (×4): qty 2

## 2017-04-20 MED ORDER — BUSPIRONE HCL 10 MG PO TABS
10.0000 mg | ORAL_TABLET | Freq: Two times a day (BID) | ORAL | Status: DC
  Administered 2017-04-20: 10 mg via ORAL
  Filled 2017-04-20 (×2): qty 1

## 2017-04-20 MED ORDER — FENTANYL CITRATE (PF) 100 MCG/2ML IJ SOLN
50.0000 ug | Freq: Once | INTRAMUSCULAR | Status: AC
Start: 2017-04-20 — End: 2017-04-20
  Administered 2017-04-20: 50 ug via INTRAVENOUS

## 2017-04-20 MED ORDER — SODIUM CHLORIDE 0.9% FLUSH
3.0000 mL | Freq: Two times a day (BID) | INTRAVENOUS | Status: DC
  Administered 2017-04-20 – 2017-04-21 (×3): 3 mL via INTRAVENOUS

## 2017-04-20 MED ORDER — IBUPROFEN 400 MG PO TABS: 800.0000 mg | ORAL_TABLET | ORAL | Status: DC | PRN

## 2017-04-20 MED ORDER — METRONIDAZOLE IN NACL 5-0.79 MG/ML-% IV SOLN
500.0000 mg | Freq: Once | INTRAVENOUS | Status: AC
  Administered 2017-04-20: 500 mg via INTRAVENOUS
  Filled 2017-04-20: qty 100

## 2017-04-20 MED ORDER — HYDROCODONE-ACETAMINOPHEN 5-325 MG PO TABS: 1.0000 | ORAL_TABLET | ORAL | Status: DC | PRN

## 2017-04-20 MED ORDER — SODIUM CHLORIDE 0.9 % IV SOLN
1.0000 g | Freq: Once | INTRAVENOUS | Status: AC
  Administered 2017-04-20: 1 g via INTRAVENOUS
  Filled 2017-04-20: qty 10

## 2017-04-20 MED ORDER — FLUTICASONE PROPIONATE 50 MCG/ACT NA SUSP
1.0000 | Freq: Every day | NASAL | Status: DC
  Administered 2017-04-20 – 2017-04-21 (×2): 1 via NASAL
  Filled 2017-04-20: qty 16

## 2017-04-20 MED ORDER — SODIUM CHLORIDE 0.9 % IV SOLN
1.0000 g | INTRAVENOUS | Status: DC
  Administered 2017-04-20: 1 g via INTRAVENOUS
  Filled 2017-04-20 (×2): qty 10

## 2017-04-20 MED ORDER — SODIUM CHLORIDE 0.9 % IV SOLN
2.0000 g | INTRAVENOUS | Status: DC
  Filled 2017-04-20: qty 20

## 2017-04-20 MED ORDER — ALPRAZOLAM 1 MG PO TABS
2.0000 mg | ORAL_TABLET | Freq: Every day | ORAL | Status: DC
  Administered 2017-04-20: 2 mg via ORAL

## 2017-04-20 MED ORDER — POLYETHYLENE GLYCOL 3350 17 G PO PACK: 17.0000 g | PACK | Freq: Every day | ORAL | Status: DC | PRN

## 2017-04-20 MED ORDER — ALPRAZOLAM 1 MG PO TABS
2.0000 mg | ORAL_TABLET | Freq: Three times a day (TID) | ORAL | Status: DC
  Filled 2017-04-20: qty 2

## 2017-04-20 MED ORDER — FENTANYL CITRATE (PF) 100 MCG/2ML IJ SOLN
INTRAMUSCULAR | Status: AC
  Filled 2017-04-20: qty 2

## 2017-04-20 MED ORDER — SODIUM CHLORIDE 0.9 % IV SOLN
INTRAVENOUS | Status: DC
  Administered 2017-04-21: 01:00:00 via INTRAVENOUS

## 2017-04-20 MED ORDER — ACETAMINOPHEN 650 MG RE SUPP: 650.0000 mg | Freq: Four times a day (QID) | RECTAL | Status: DC | PRN

## 2017-04-20 MED ORDER — SODIUM CHLORIDE 0.9 % IV BOLUS (SEPSIS)
1000.0000 mL | Freq: Once | INTRAVENOUS | Status: AC
  Administered 2017-04-20: 1000 mL via INTRAVENOUS

## 2017-04-20 MED ORDER — POTASSIUM CHLORIDE 20 MEQ PO PACK
40.0000 meq | PACK | Freq: Once | ORAL | Status: AC
  Administered 2017-04-20: 40 meq via ORAL
  Filled 2017-04-20: qty 2

## 2017-04-20 MED ORDER — BUPROPION HCL ER (XL) 150 MG PO TB24
300.0000 mg | ORAL_TABLET | Freq: Every day | ORAL | Status: DC
  Administered 2017-04-20 – 2017-04-21 (×2): 300 mg via ORAL
  Filled 2017-04-20 (×2): qty 2

## 2017-04-20 MED ORDER — KCL IN DEXTROSE-NACL 20-5-0.9 MEQ/L-%-% IV SOLN
INTRAVENOUS | Status: AC
  Administered 2017-04-20 (×2): via INTRAVENOUS
  Filled 2017-04-20 (×3): qty 1000

## 2017-04-20 MED ORDER — IPRATROPIUM BROMIDE HFA 17 MCG/ACT IN AERS: 2.0000 | INHALATION_SPRAY | RESPIRATORY_TRACT | Status: DC | PRN

## 2017-04-20 MED ORDER — ENOXAPARIN SODIUM 40 MG/0.4ML ~~LOC~~ SOLN
40.0000 mg | SUBCUTANEOUS | Status: DC
  Administered 2017-04-20: 40 mg via SUBCUTANEOUS
  Filled 2017-04-20: qty 0.4

## 2017-04-20 MED ORDER — ONDANSETRON HCL 4 MG/2ML IJ SOLN
4.0000 mg | Freq: Four times a day (QID) | INTRAMUSCULAR | Status: DC | PRN
  Administered 2017-04-20: 4 mg via INTRAVENOUS
  Filled 2017-04-20: qty 2

## 2017-04-20 MED ORDER — ONDANSETRON HCL 4 MG PO TABS: 4.0000 mg | ORAL_TABLET | Freq: Four times a day (QID) | ORAL | Status: DC | PRN

## 2017-04-20 MED ORDER — PANTOPRAZOLE SODIUM 40 MG IV SOLR
40.0000 mg | Freq: Two times a day (BID) | INTRAVENOUS | Status: DC
Start: 2017-04-20 — End: 2017-04-21
  Administered 2017-04-20 – 2017-04-21 (×3): 40 mg via INTRAVENOUS
  Filled 2017-04-20 (×3): qty 40

## 2017-04-20 MED ORDER — SODIUM CHLORIDE 0.9 % IV SOLN: 1.0000 g | INTRAVENOUS | Status: DC

## 2017-04-20 MED ORDER — ALPRAZOLAM 1 MG PO TABS
2.0000 mg | ORAL_TABLET | Freq: Three times a day (TID) | ORAL | Status: DC | PRN
  Administered 2017-04-21: 2 mg via ORAL
  Filled 2017-04-20 (×2): qty 2

## 2017-04-20 MED ORDER — ONDANSETRON HCL 4 MG/2ML IJ SOLN
4.0000 mg | Freq: Once | INTRAMUSCULAR | Status: DC
  Filled 2017-04-20: qty 2

## 2017-04-20 MED ORDER — MAGNESIUM SULFATE 2 GM/50ML IV SOLN: 2.0000 g | Freq: Once | INTRAVENOUS | Status: DC

## 2017-04-20 MED ORDER — FENTANYL CITRATE (PF) 100 MCG/2ML IJ SOLN
50.0000 ug | Freq: Once | INTRAMUSCULAR | Status: AC
  Administered 2017-04-20: 50 ug via INTRAVENOUS

## 2017-04-20 MED ORDER — BISACODYL 5 MG PO TBEC: 5.0000 mg | DELAYED_RELEASE_TABLET | Freq: Every day | ORAL | Status: DC | PRN

## 2017-04-20 MED ORDER — IPRATROPIUM BROMIDE 0.02 % IN SOLN: 0.5000 mg | RESPIRATORY_TRACT | Status: DC | PRN

## 2017-04-20 NOTE — ED Notes (Signed)
Pt reports upper abd pain with nausea.  Pt had 2 syncopal episodes in triage while doing ekg. Pt reports upper abd pain 2 weeks ago with worsening pain since last night.  nsr on monitor.  No chest pain.  No sob.  Former smoker.  Pt reports intermittent dizziness.  No v/d.  2 iv's started an pt placed on monitor with cardiac pads in place.  md at bedside.  Labs sent

## 2017-04-20 NOTE — Progress Notes (Signed)
Pharmacy Electrolyte Monitoring Consult:  Pharmacy consulted to assist in monitoring and replacing electrolytes in this 47 y.o. female admitted on 04/20/2017 with small bowel enteritis. Patient with epigastric pain radiating to back for approximately 2 weeks.   Patient is currently ordered D5NS with 3mEq Potassium/L at 116mL/hr. Magnesium is normal.   Labs:  Sodium (mmol/L)  Date Value  04/20/2017 134 (L)  06/13/2015 141  08/24/2011 143   Potassium (mmol/L)  Date Value  04/20/2017 3.1 (L)  08/24/2011 3.3 (L)   Magnesium (mg/dL)  Date Value  04/20/2017 2.3   Phosphorus (mg/dL)  Date Value  04/20/2017 2.9   Calcium (mg/dL)  Date Value  04/20/2017 9.4   Calcium, Total (mg/dL)  Date Value  08/24/2011 8.3 (L)   Albumin (g/dL)  Date Value  04/20/2017 4.5  08/24/2011 3.6    Plan: Phos WNL. Will f/u AM labs.  Pharmacy will continue to monitor and adjust per consult.   Ulice Dash D 04/20/2017 1:58 PM

## 2017-04-20 NOTE — Progress Notes (Signed)
Same day rounding progress note  Ariel Bryant is a 47 y.o. y/o female admitted with acute onset abdominal pain.    * acute small bowel infectious enteritis - epigastric pain radiating into the back, diaphoresis, nausea, ill-appearing, diaphoresis, poor p.o. intake, leukocytosis, CT abdomen showing enteritis - appreciate GI input - No NSAID's - they recommend Conservative supportive therapy - stopping antibiotics - IF has diarrhea check for stool PCR - Serial abdominal exam and if pain worsens then will need repeat imaging  - outpatient GI f/up and MR enterogram to r/o crohns disease - No endoscopic evaluation while here per GI  * acute arrhythmia Associated with syncopal episodes x2 in the emergency room, EKG with a few seconds of ?   noted history of POTS, and dysautonomia - d/w Dr Caryl Comes - he recommends no further w/up while here as well and get her better from GI stand point and then outpt eval Echocardiogram from 2017 was a normal study  * acute syncopal episode  most likely secondary to above - normal echocardiogram, carotid Dopplers showing no significant stenosis but shows thyroid nodule,   * Thyroid Nodule - seen on carotid US - will check thyroid US and TSH  * acute hypokalemia, hyponatremia, hypochloremia - replete and recheck  * history of POTS - no cardiac w/up while input other than echo per EP  6 chronic GERD PPI twice daily if given enteritis   Time spent: 15 mins

## 2017-04-20 NOTE — Progress Notes (Signed)
Pharmacy Electrolyte Monitoring Consult:  Pharmacy consulted to assist in monitoring and replacing electrolytes in this 47 y.o. female admitted on 04/20/2017 with small bowel enteritis. Patient with epigastric pain radiating to back for approximately 2 weeks.   Patient is currently ordered D5NS with 91mEq Potassium/L at 115mL/hr. Magnesium is normal.   Labs:  Sodium (mmol/L)  Date Value  04/20/2017 134 (L)  06/13/2015 141  08/24/2011 143   Potassium (mmol/L)  Date Value  04/20/2017 3.1 (L)  08/24/2011 3.3 (L)   Magnesium (mg/dL)  Date Value  04/20/2017 2.3   Calcium (mg/dL)  Date Value  04/20/2017 9.4   Calcium, Total (mg/dL)  Date Value  08/24/2011 8.3 (L)   Albumin (g/dL)  Date Value  04/20/2017 4.5  08/24/2011 3.6    Plan: IV infusion will provide 48 mEq of potassium over 24 hours. Will obtain phosphorus and assess the need for further supplementation.   Pharmacy will continue to monitor and adjust per consult.   Wynne Jury L 04/20/2017 10:40 AM

## 2017-04-20 NOTE — Consult Note (Signed)
Jonathon Bellows , MD [address], [place], [place], [place], [address] [id number], [place], [place], [place], [address] Phone: [phone]  Fax: [phone]  Consultation  Referring Provider: Dr Manuella Ghazi  Primary Care Physician:  Denton Lank, MD Primary Gastroenterologist:  Dr. Bayard Hugger         Reason for Consultation:     Abdominal pain and diarrhea   Date of Admission:  04/20/2017 Date of Consultation:  04/20/2017         HPI:   Ariel Bryant is a 47 y.o. female known to [facility] , last seen in 03/2016 by Dr Bayard Hugger and A CT scan in 2011 demonstrated thickened segment of small bowel in the lower mid pelvic region with mesenteric stranding suggestive of enteritis. Normal preceeding CT scans in 2008 and 2006. His impression was that she suffered from dysautonomia.   She presented to the hospital on 04/20/17 wih epigastric pain , in the ER there were some pauses on her EKG and a syncopal event. Ct scan of the abdomen showed small bowel enteritis, dilated stomach with fluid and food . Low potassium on admission at 3.1 . [place] normalized this morning .   She says that till 2 weeks back had no issues then had some abdominal pain for 1 day which resolved, then recurred for two days prior to admission. Since coming into the hospital has improved and not as severe.  NSAID's: daily for many months for general aches and pains.   Past Medical History:  Diagnosis Date  . Anxiety   . GERD (gastroesophageal reflux disease)   . PTSD (post-traumatic stress disorder)   . Sinus tachycardia     Past Surgical History:  Procedure Laterality Date  . BREAST BIOPSY Left 12/28/2016   Affirm Biopsy- path pending. RIbbon clip  . CESAREAN SECTION     NSD x 3 followed by C/S for 8 + pounds breech  . CHOLECYSTECTOMY    . LAPAROSCOPIC OOPHERECTOMY    . VAGINAL HYSTERECTOMY     had BTL, then VH, later developed ovarian mass and had ooph at 47 yo    Prior to Admission medications   Medication Sig Start  Date End Date Taking? Authorizing Provider  alprazolam Duanne Moron) 2 MG tablet Take 2 mg by mouth 3 (three) times daily. 08/17/16  Yes [provider]  Ascorbic Acid (VITAMIN C) 1000 MG tablet Take 1,000 mg by mouth daily.   Yes [provider]  Biotin 1 MG CAPS Take 1 capsule by mouth daily.   Yes [provider]  buPROPion (WELLBUTRIN XL) 150 MG 24 hr tablet Take 2 tablets by mouth daily. 03/02/17  Yes [provider]  Calcium Carb-Cholecalciferol (CALCIUM + D3) 600-200 MG-UNIT TABS Take 1 tablet by mouth 2 (two) times daily.    Yes [provider]  fluticasone (FLONASE) 50 MCG/ACT nasal spray Place 1 spray into both nostrils daily. For Eustachian Tube Dysfunction 05/25/15  Yes [provider]  ketoconazole (NIZORAL) 2 % shampoo APPLY TO DAMP SCALP, LATHER, LEAVE ON FOR 3-5 MINUTES, THEN RINSE 3 TIMES/WEEK UNTIL BETTER 03/09/17  Yes [provider]  omeprazole (PRILOSEC) 40 MG capsule Take 40 mg by mouth daily.   Yes [provider]  acetaminophen (TYLENOL) 325 MG tablet Take 650 mg by mouth as needed for mild pain, moderate pain, fever or headache.     [provider]  acyclovir (ZOVIRAX) 400 MG tablet Take 400 mg by mouth as needed (AS NEEDED FOR  COLD SORES).     [provider]  busPIRone (BUSPAR) 10 MG tablet Take 10 mg by mouth 2 (two) times daily. 04/13/17   [provider]  ibuprofen (ADVIL,MOTRIN) 200 MG tablet Take 800 mg by mouth as needed for fever, headache, mild pain, moderate pain or cramping.     [provider]  ipratropium (ATROVENT HFA) 17 MCG/ACT inhaler Inhale 2 puffs into the lungs every 4 (four) hours as needed for wheezing.    [provider]  Magnesium 500 MG TABS Take 1 tablet by mouth daily as needed.     [provider]  potassium gluconate 595 (99 K) MG TABS tablet Take 595 mg by mouth 3 (three) times a week.    [provider]  vitamin B-12  (CYANOCOBALAMIN) 1000 MCG tablet Take 1,000 mcg by mouth as needed.     [provider]    Family History  Problem Relation Age of Onset  . Hypertension Father   . Breast cancer Maternal Aunt 30  . Breast cancer Paternal Aunt   . Breast cancer Paternal Aunt      Social History   Tobacco Use  . Smoking status: Former Research scientist (life sciences)  . Smokeless tobacco: Never Used  Substance Use Topics  . Alcohol use: No  . Drug use: No    Allergies as of 04/20/2017 - Review Complete 04/20/2017  Allergen Reaction Noted  . Codeine Shortness Of Breath 10/05/2014  . Sulfa antibiotics Anaphylaxis 10/05/2014  . Azithromycin Other (See Comments) 06/13/2015  . Ciprofloxacin Other (See Comments) 06/13/2015  . Doxycycline monohydrate Other (See Comments) 06/13/2015  . Erythromycin Other (See Comments) 06/13/2015  . Doxycycline Rash 10/05/2014    Review of Systems:    All systems reviewed and negative except where noted in HPI.   Physical Exam:  Vital signs in last 24 hours: Temp:  [97.7 F (36.5 C)-98.4 F (36.9 C)] 98 F (36.7 C) (03/19 0855) Pulse Rate:  [65-106] 65 (03/19 0855) Resp:  [12-37] 16 (03/19 0855) BP: (92-141)/(54-95) 92/54 (03/19 0855) SpO2:  [95 %-100 %] 100 % (03/19 0855) Weight:  [168 lb 9.6 oz (76.5 kg)-171 lb (77.6 kg)] 168 lb 9.6 oz (76.5 kg) (03/19 0629) Last BM Date: 04/19/17 General:   Pleasant, cooperative in NAD Head:  Normocephalic and atraumatic. Eyes:   No icterus.   Conjunctiva pink. PERRLA. Ears:  Normal auditory acuity. Neck:  Supple; no masses or thyroidomegaly Lungs: Respirations even and unlabored. Lungs clear to auscultation bilaterally.   No wheezes, crackles, or rhonchi.  Heart:  Regular rate and rhythm;  Without murmur, clicks, rubs or gallops Abdomen:  Soft, nondistended, nontender. Normal bowel sounds. No appreciable masses or hepatomegaly.  No rebound or guarding.  Neurologic:  Alert and oriented x3;  grossly normal neurologically. Skin:  Intact  without significant lesions or rashes. Cervical Nodes:  No significant cervical adenopathy. Psych:  Alert and cooperative. Normal affect.  LAB RESULTS: Recent Labs    04/20/17 0035 04/20/17 0546  WBC 15.4* 10.2  HGB 14.3 12.9  HCT 43.5 38.5  PLT 365 222   BMET Recent Labs    04/20/17 0035 04/20/17 0841  NA 134*  --   K 3.1*  --   CL 100*  --   CO2 19*  --   GLUCOSE 134*  --   BUN 10  --   CREATININE 0.70 0.66  CALCIUM 9.4  --    LFT Recent Labs    04/20/17 0035  PROT 8.1  ALBUMIN 4.5  AST 28  ALT 22  ALKPHOS 64  BILITOT 0.6   PT/INR No results for input(s): LABPROT, INR in the last 72 hours.  STUDIES: Ct Abdomen Pelvis W Contrast  Result Date: 04/20/2017 CLINICAL DATA:  Midsternal chest pain radiating to the back for several weeks accompanied by nausea and diaphoresis. EXAM: CT ABDOMEN AND PELVIS WITH CONTRAST TECHNIQUE: Multidetector CT imaging of the abdomen and pelvis was performed using the standard protocol following bolus administration of intravenous contrast. CONTRAST:  116mL ISOVUE-300 IOPAMIDOL (ISOVUE-300) INJECTION 61% COMPARISON:  04/06/2009 FINDINGS: Lower chest: Normal heart size without pericardial effusion. Scarring or atelectasis at the lung bases. No effusion or pneumothorax. Hepatobiliary: Status post cholecystectomy. Homogeneous appearance of the liver without space-occupying mass or biliary dilatation. Pancreas: No ductal dilatation, mass nor inflammation. Spleen: Normal Adrenals/Urinary Tract: Normal bilateral adrenal glands and kidneys. No hydroureteronephrosis. Physiologic distention of the urinary bladder without focal mural thickening or calculus. Stomach/Bowel: Fluid and food distended stomach with normal small bowel rotation. Mild to moderate fluid-filled distention of small bowel loops in the left hemiabdomen without mechanical source of obstruction raising the possibility of small bowel enteritis. Ileal loops are relatively decompressed in  appearance. Normal terminal ileum. No large bowel obstruction. Scattered colonic diverticulosis along the distal descending proximal sigmoid colon without acute diverticulitis. Normal appearing appendix. Vascular/Lymphatic: No significant vascular findings are present. No enlarged abdominal or pelvic lymph nodes. Reproductive: Status post hysterectomy. No adnexal masses. Other: No abdominal wall hernia or abnormality. No abdominopelvic ascites. Musculoskeletal: No acute or significant osseous findings. IMPRESSION: Mild fluid-filled distention of jejunal loops suspicious for mild small bowel enteritis. No mechanical obstruction is noted. Electronically Signed   By: Ashley Royalty M.D.   On: 04/20/2017 04:01   Dg Chest Port 1 View  Result Date: 04/20/2017 CLINICAL DATA:  Mid chest pain. EXAM: PORTABLE CHEST 1 VIEW COMPARISON:  Radiograph 05/30/2015 FINDINGS: Multiple overlying monitoring devices overlie the chest.The cardiomediastinal contours are normal. The lungs are clear. Pulmonary vasculature is normal. No consolidation, pleural effusion, or pneumothorax. No acute osseous abnormalities are seen. IMPRESSION: No acute pulmonary process. Electronically Signed   By: Jeb Levering M.D.   On: 04/20/2017 01:18      Impression / Plan:   Ariel Bryant is a 47 y.o. y/o female admitted with acute onset abdominal pain. CT scan of the abdomen shows distended stomach, small bowel and enteritis. Very likely etiology is infectious or NSAID related  in origin There is a similar picture of a CT scan back in 2011 which occasionally may suggest crohns disease. Her present admission is complicated with a pause on the EKG with syncope which is being evaluated.    Plan  1. No NSAID's 2. Conservative supportive therapy -can stop antibiotics 3. IF has diarrhea check for stool PCR 4. Serial abdominal exam and if pain worsens then will need repeat imaging  5. As an outpatient would consider MR enterogram to r/o crohns  disease  At this point of time especially with her cardiac issues would not recommend any endoscopic evaluation.   Thank you for involving me in the care of this patient.      LOS: 0 days   Jonathon Bellows, MD  04/20/2017, 10:45 AM

## 2017-04-20 NOTE — ED Notes (Signed)
Report off to jen rn.  

## 2017-04-20 NOTE — Progress Notes (Signed)
   04/20/17 1130  Clinical Encounter Type  Visited With Patient and family together  Visit Type Initial (order for prayer and advanced directive)  Referral From Nurse  Consult/Referral To [place] responded to OR for prayer and advanced directive.  Chaplain provided education on advanced directives and encouraged conversation between patient and potential HCPOA.  Chaplain let patient/spouse know about chaplain availability and encouraged them to contact chaplains with any questions or when ready to complete the document.  Chaplain prayed with patient and spouse, patient wanted prayers for healing and thankfulness included.

## 2017-04-20 NOTE — Plan of Care (Signed)
  Progressing Health Behavior/Discharge Planning: Ability to manage health-related needs will improve 04/20/2017 1550 - Progressing by Liliane Channel, RN Clinical Measurements: Ability to maintain clinical measurements within normal limits will improve 04/20/2017 1550 - Progressing by Liliane Channel, RN Will remain free from infection 04/20/2017 1550 - Progressing by Liliane Channel, RN Diagnostic test results will improve 04/20/2017 1550 - Progressing by Liliane Channel, RN Cardiovascular complication will be avoided 04/20/2017 1550 - Progressing by Liliane Channel, RN Pain Managment: General experience of comfort will improve 04/20/2017 1550 - Progressing by Liliane Channel, RN Safety: Ability to remain free from injury will improve 04/20/2017 1550 - Progressing by Liliane Channel, RN Spiritual Needs Ability to function at adequate level 04/20/2017 1550 - Progressing by Liliane Channel, RN

## 2017-04-20 NOTE — Consult Note (Signed)
Cardiology Consultation:   Patient ID: [name]; [id number]; 06-20-1970   Admit date: 04/20/2017 Date of Consult: 04/20/2017  Primary Care Provider: Denton Lank, MD Primary Cardiologist: Caryl Comes   Patient Profile:   [name] is a 47 y.o. female with a hx of dysautonomia, frequent PVCs, IBS, PTSD, anxiety and GERD who is being seen today for the evaluation of syncope and bradycardi   at the request of Dr. Jerelyn Charles.  History of Present Illness:   Ms. Pore was previously followed by Dr. Yvone Neu, though more recently has been seen by Dr, Caryl Comes. Prior echo in 06/2015 showed EF 55-60%, no RWMA, normal LV diastolic function, PASP normal. Cardiac monitoring in 07/2015 showed NSR without significant arrhythmia. Patient detected events corresponded to sinus rhythm/snus arrhythmia/isolated PAC/isolated PVC/sinus tachycardia. Alive Core monitor has corresponded to sinus rhythm.   She was admitted to [facility] with abdominal pain. CT scan showed acute small bowl enteritis.   While she is being hooked up, she had more of her typical syncopal episodes with a stereo typical prodrome.  ECG was been obtained at that time and demonstrated significant bradycardia with gradual PP shortening.  She apparently had another syncopal episode which was not documented.  When ECGs a couple minutes later were normal.  She was treated with volume resuscitation and is feeling better.   Past Medical History:  Diagnosis Date  . Anxiety   . GERD (gastroesophageal reflux disease)   . PTSD (post-traumatic stress disorder)   . Sinus tachycardia     Past Surgical History:  Procedure Laterality Date  . BREAST BIOPSY Left 12/28/2016   Affirm Biopsy- path pending. RIbbon clip  . CESAREAN SECTION     NSD x 3 followed by C/S for 8 + pounds breech  . CHOLECYSTECTOMY    . LAPAROSCOPIC OOPHERECTOMY    . VAGINAL HYSTERECTOMY     had BTL, then VH, later developed ovarian mass and had ooph at 47 yo      Home Meds: Prior to Admission medications   Medication Sig Start Date End Date Taking? Authorizing Provider  alprazolam Duanne Moron) 2 MG tablet Take 2 mg by mouth 3 (three) times daily. 08/17/16  Yes [provider]  Ascorbic Acid (VITAMIN C) 1000 MG tablet Take 1,000 mg by mouth daily.   Yes [provider]  Biotin 1 MG CAPS Take 1 capsule by mouth daily.   Yes [provider]  buPROPion (WELLBUTRIN XL) 150 MG 24 hr tablet Take 2 tablets by mouth daily. 03/02/17  Yes [provider]  Calcium Carb-Cholecalciferol (CALCIUM + D3) 600-200 MG-UNIT TABS Take 1 tablet by mouth 2 (two) times daily.    Yes [provider]  fluticasone (FLONASE) 50 MCG/ACT nasal spray Place 1 spray into both nostrils daily. For Eustachian Tube Dysfunction 05/25/15  Yes [provider]  ketoconazole (NIZORAL) 2 % shampoo APPLY TO DAMP SCALP, LATHER, LEAVE ON FOR 3-5 MINUTES, THEN RINSE 3 TIMES/WEEK UNTIL BETTER 03/09/17  Yes [provider]  omeprazole (PRILOSEC) 40 MG capsule Take 40 mg by mouth daily.   Yes [provider]  acetaminophen (TYLENOL) 325 MG tablet Take 650 mg by mouth as needed for mild pain, moderate pain, fever or headache.     [provider]  acyclovir (ZOVIRAX) 400 MG tablet Take 400 mg by mouth as needed (AS NEEDED FOR COLD SORES).     [provider]  busPIRone (BUSPAR) 10 MG tablet Take 10 mg by mouth 2 (two)  times daily. 04/13/17   [provider]  ibuprofen (ADVIL,MOTRIN) 200 MG tablet Take 800 mg by mouth as needed for fever, headache, mild pain, moderate pain or cramping.     [provider]  ipratropium (ATROVENT HFA) 17 MCG/ACT inhaler Inhale 2 puffs into the lungs every 4 (four) hours as needed for wheezing.    [provider]  Magnesium 500 MG TABS Take 1 tablet by mouth daily as needed.     [provider]  potassium gluconate 595 (99 K) MG TABS tablet Take 595 mg by mouth 3  (three) times a week.    [provider]  vitamin B-12 (CYANOCOBALAMIN) 1000 MCG tablet Take 1,000 mcg by mouth as needed.     [provider]    Inpatient Medications: Scheduled Meds: . alprazolam  2 mg Oral TID  . buPROPion  300 mg Oral Daily  . busPIRone  10 mg Oral BID  . enoxaparin (LOVENOX) injection  40 mg Subcutaneous Q24H  . fluticasone  1 spray Each Nare Daily  . metroNIDAZOLE  500 mg Oral TID  . ondansetron (ZOFRAN) IV  4 mg Intravenous Once  . pantoprazole (PROTONIX) IV  40 mg Intravenous Q12H  . potassium chloride  40 mEq Oral Once  . sodium chloride flush  3 mL Intravenous Q12H   Continuous Infusions: . cefTRIAXone (ROCEPHIN)  IV 1 g (04/20/17 0818)  . dextrose 5 % and 0.9 % NaCl with KCl 20 mEq/L 100 mL/hr at 04/20/17 0818   PRN Meds: acetaminophen **OR** acetaminophen, bisacodyl, ibuprofen, ipratropium, ondansetron **OR** ondansetron (ZOFRAN) IV, polyethylene glycol  Allergies:   Allergies  Allergen Reactions  . Codeine Shortness Of Breath  . Sulfa Antibiotics Anaphylaxis  . Azithromycin Other (See Comments)  . Ciprofloxacin Other (See Comments)  . Doxycycline Monohydrate Other (See Comments)  . Erythromycin Other (See Comments)  . Doxycycline Rash    Social History:   Social History   Socioeconomic History  . Marital status: Married    Spouse name: Not on file  . Number of children: Not on file  . Years of education: Not on file  . Highest education level: Not on file  Social Needs  . Financial resource strain: Not on file  . Food insecurity - worry: Not on file  . Food insecurity - inability: Not on file  . Transportation needs - medical: Not on file  . Transportation needs - non-medical: Not on file  Occupational History  . Not on file  Tobacco Use  . Smoking status: Former Research scientist (life sciences)  . Smokeless tobacco: Never Used  Substance and Sexual Activity  . Alcohol use: No  . Drug use: No  . Sexual activity: Not on file  Other  Topics Concern  . Not on file  Social History Narrative  . Not on file     Family History:   Family History  Problem Relation Age of Onset  . Hypertension Father   . Breast cancer Maternal Aunt 30  . Breast cancer Paternal Aunt   . Breast cancer Paternal Aunt      ROS  Neg x as above  Physical Exam/Data:   Vitals:   04/20/17 0459 04/20/17 0500 04/20/17 0629 04/20/17 0855  BP:  118/84 126/73 (!) 92/54  Pulse: (!) 105 96 89 65  Resp: (!) 21 17 18 16   Temp:   97.7 F (36.5 C) 98 F (36.7 C)  TempSrc:   Oral Oral  SpO2: 99% 99% 100% 100%  Weight:  168 lb 9.6 oz (76.5 kg)   Height:   5\' 4"  (1.626 m)     Intake/Output Summary (Last 24 hours) at 04/20/2017 1026 Last data filed at 04/20/2017 0819 Gross per 24 hour  Intake 701.67 ml  Output -  Net 701.67 ml   Filed Weights   04/20/17 0019 04/20/17 0629  Weight: 171 lb (77.6 kg) 168 lb 9.6 oz (76.5 kg)   Body mass index is 28.94 kg/m.   Physical Exam  General: Well developed, well nourished, in no acute distress. Head: Normocephalic, atraumatic, sclera non-icteric, no xanthomas, nares without discharge.  Neck: Negative for carotid bruits. JVD not elevated. Lungs: Clear bilaterally to auscultation without wheezes, rales, or rhonchi. Breathing is unlabored. Heart: RRR with S1 S2. No murmurs, rubs, or gallops appreciated. Abdomen: Soft, non-tender, non-distended with normoactive bowel sounds. No hepatomegaly. No rebound/guarding. No obvious abdominal masses. Msk:  Strength and tone appear normal for age. Extremities: No clubbing or cyanosis. No edema. Distal pedal pulses are 2+ and equal bilaterally. Neuro: Alert and oriented X 3. No facial asymmetry. No focal deficit. Moves all extremities spontaneously. Psych:  Responds to questions appropriately with a normal affect.   EKG:  The EKG was personally reviewed and demonstrates: NSR, 93 bpm, baseline wandering, no acute st/t changes Telemetry:  Telemetry was personally  reviewed and demonstrates: sinus   Weights: Filed Weights   04/20/17 0019 04/20/17 0629  Weight: 171 lb (77.6 kg) 168 lb 9.6 oz (76.5 kg)    Relevant CV Studies: TTE pending  Laboratory Data:  Chemistry Recent Labs  Lab 04/20/17 0035 04/20/17 0841  NA 134*  --   K 3.1*  --   CL 100*  --   CO2 19*  --   GLUCOSE 134*  --   BUN 10  --   CREATININE 0.70 0.66  CALCIUM 9.4  --   GFRNONAA >60 >60  GFRAA >60 >60  ANIONGAP 15  --     Recent Labs  Lab 04/20/17 0035  PROT 8.1  ALBUMIN 4.5  AST 28  ALT 22  ALKPHOS 64  BILITOT 0.6   Hematology Recent Labs  Lab 04/20/17 0035 04/20/17 0546  WBC 15.4* 10.2  RBC 5.16 4.52  HGB 14.3 12.9  HCT 43.5 38.5  MCV 84.4 85.3  MCH 27.8 28.6  MCHC 32.9 33.5  RDW 12.9 12.9  PLT 365 222   Cardiac Enzymes Recent Labs  Lab 04/20/17 0035 04/20/17 0546  TROPONINI <0.03 <0.03   No results for input(s): TROPIPOC in the last 168 hours.  BNPNo results for input(s): BNP, PROBNP in the last 168 hours.  DDimer No results for input(s): DDIMER in the last 168 hours.  Radiology/Studies:  Ct Abdomen Pelvis W Contrast  Result Date: 04/20/2017 IMPRESSION: Mild fluid-filled distention of jejunal loops suspicious for mild small bowel enteritis. No mechanical obstruction is noted. Electronically Signed   By: Ashley Royalty M.D.   On: 04/20/2017 04:01   Dg Chest Port 1 View  Result Date: 04/20/2017 IMPRESSION: No acute pulmonary process. Electronically Signed   By: Jeb Levering M.D.   On: 04/20/2017 01:18    Assessment and Plan:      1) Dysautonomia  2) Syncope neurally mediated    3)  Enteritis with systemic signs of illness  With ketonuria suggestive of vascular depletion   4)  Anxiety/stress disorder PTSD  5)  Hypokalemia     The pt presented with systemic illness and GI symptoms    She had  syncope, and serendipity allowed for ecg recording, presumably during the recover phase as her PP interval shortened and sinus  rhythm competed with junctional rhythm.  The second event was not recorded   There is no reason to suspect primary arrhythmia disorder in this young lady with an otherwise normal ECG--  Treatments are directed at fluid resuscitation.    Virl Axe

## 2017-04-20 NOTE — ED Notes (Signed)
Repeat ekg done

## 2017-04-20 NOTE — ED Notes (Signed)
Calling report to [phone]

## 2017-04-20 NOTE — ED Triage Notes (Signed)
Pt to triage via w/c with on distress noted; pt reports abdominal pain--points to midsternal chest radiating into back last several wks accomp by nausea and diaphoresis; denies hx of same

## 2017-04-20 NOTE — Progress Notes (Addendum)
Pt was asking if she can advance her diet. Doctor Manuella Ghazi was notified and ordered to advance her on a regular diet. Will continue to monitor.

## 2017-04-20 NOTE — ED Notes (Signed)
Family with pt   Pain meds given

## 2017-04-20 NOTE — Progress Notes (Addendum)
Pharmacy Antibiotic Note  Ariel Bryant is a 47 y.o. female admitted on 04/20/2017 with small bowel enteritis. Patient with epigastric pain radiating to back for approximately 2 weeks. Pharmacy has been consulted for ceftriaxone dosing. Patient also ordered metronidazole 500mg  PO Q8hr.   Plan: Patient ordered ceftriaxone 1g IV q24hr. Will adjust regimen to ceftriaxone 2g IV Q24hr. Will order additional ceftriaxone 1g IV x 1 to make total dose of ceftriaxone 2g on 3/19.   Per admission note, patient to receive total of 7 days of therapy.   Height: 5\' 4"  (162.6 cm) Weight: 168 lb 9.6 oz (76.5 kg) IBW/kg (Calculated) : 54.7  Temp (24hrs), Avg:98 F (36.7 C), Min:97.7 F (36.5 C), Max:98.4 F (36.9 C)  Recent Labs  Lab 04/20/17 0035 04/20/17 0546 04/20/17 0841  WBC 15.4* 10.2  --   CREATININE 0.70  --  0.66  LATICACIDVEN  --  1.6 1.4    Estimated Creatinine Clearance: 87 mL/min (by C-G formula based on SCr of 0.66 mg/dL).    Allergies  Allergen Reactions  . Codeine Shortness Of Breath  . Sulfa Antibiotics Anaphylaxis  . Azithromycin Other (See Comments)  . Ciprofloxacin Other (See Comments)  . Doxycycline Monohydrate Other (See Comments)  . Erythromycin Other (See Comments)  . Doxycycline Rash    Antimicrobials this admission: Ceftriaxone 3/19 >>  Metronidazole 3/19 >>   Dose adjustments this admission: 3/19 Ceftriaxone increased from 1g Q24hr to 2g Q24hr  Microbiology results: 3/19 BCx: pending  Thank you for allowing pharmacy to be a part of this patient's care.  Simpson,Michael L 04/20/2017 10:34 AM

## 2017-04-20 NOTE — ED Provider Notes (Signed)
[facility] Emergency Department Provider Note   ____________________________________________   First MD Initiated Contact with Patient 04/20/17 0032     (approximate)  I have reviewed the triage vital signs and the nursing notes.   HISTORY  Chief Complaint Abdominal Pain   HPI ROSILAND SEN is a 47 y.o. female who comes into the hospital today with some epigastric and left upper quadrant pain.  The symptoms started a couple of weeks ago.  The patient states that the pain kept up all night yesterday and she felt achy.  Tonight the patient did not have an appetite.  She reports that her husband rubbed her stomach and the pain has been on and off.  The patient states that she was feeling nauseous and went to the bathroom.  She reports that she strained to have a bowel movement thinking that may have been part of the reason she had the symptoms.  She only had a little bit of a bowel movement but then broke out into a sweat with some nausea and pain.  The patient decided to come in and get checked out.  While the patient was in triage getting her EKG she passed out and had an episode of asystole as well as some bradycardia.  The patient states that she is not having any significant pain right now.  She is here today for evaluation.   Past Medical History:  Diagnosis Date  . Anxiety   . GERD (gastroesophageal reflux disease)   . PTSD (post-traumatic stress disorder)   . Sinus tachycardia     Patient Active Problem List   Diagnosis Date Noted  . Mass of upper outer quadrant of left breast 01/14/2017    Past Surgical History:  Procedure Laterality Date  . BREAST BIOPSY Left 12/28/2016   Affirm Biopsy- path pending. RIbbon clip  . CESAREAN SECTION     NSD x 3 followed by C/S for 8 + pounds breech  . CHOLECYSTECTOMY    . LAPAROSCOPIC OOPHERECTOMY    . VAGINAL HYSTERECTOMY     had BTL, then VH, later developed ovarian mass and had ooph at 47 yo     Prior to Admission medications   Medication Sig Start Date End Date Taking? Authorizing Provider  acetaminophen (TYLENOL) 325 MG tablet Take 650 mg by mouth as needed for mild pain, moderate pain, fever or headache.     [provider]  acyclovir (ZOVIRAX) 400 MG tablet Take 400 mg by mouth as needed (AS NEEDED FOR COLD SORES).     [provider]  alprazolam Duanne Moron) 2 MG tablet Take 2 mg by mouth 3 (three) times daily. 08/17/16   [provider]  Ascorbic Acid (VITAMIN C) 1000 MG tablet Take 1,000 mg by mouth daily.    [provider]  Biotin 1 MG CAPS Take 1 capsule by mouth daily.    [provider]  Calcium Carb-Cholecalciferol (CALCIUM + D3) 600-200 MG-UNIT TABS Take 1 tablet by mouth 2 (two) times daily.     [provider]  conjugated estrogens (PREMARIN) vaginal cream Place 1 Applicatorful vaginally 2 (two) times a week.    [provider]  cyclobenzaprine (FLEXERIL) 10 MG tablet Take 1 tablet by mouth at bedtime as needed for muscle spasms. Reported on 05/30/2015 05/25/15   [provider]  FLUARIX QUADRIVALENT 0.5 ML injection Inject 0.5 mLs as directed once. 10/22/15   [provider]  fluticasone (FLONASE) 50 MCG/ACT nasal spray Place 1  spray into both nostrils daily. For Eustachian Tube Dysfunction 05/25/15   [provider]  ibuprofen (ADVIL,MOTRIN) 200 MG tablet Take 800 mg by mouth as needed for fever, headache, mild pain, moderate pain or cramping.     [provider]  ipratropium (ATROVENT HFA) 17 MCG/ACT inhaler Inhale 2 puffs into the lungs every 4 (four) hours as needed for wheezing.    [provider]  Magnesium 500 MG TABS Take 1 tablet by mouth daily as needed.     [provider]  omeprazole (PRILOSEC) 40 MG capsule Take 40 mg by mouth daily.    [provider]  potassium gluconate 595 (99 K) MG TABS tablet Take 595 mg by mouth 3 (three) times a week.     [provider]  valACYclovir (VALTREX) 1000 MG tablet Take 2 tablets by mouth every 12 (twelve) hours as needed (AS NEEDED FOR COLD SORES OR SHINGLES OUTBREAK.).  05/10/15   [provider]  vitamin B-12 (CYANOCOBALAMIN) 1000 MCG tablet Take 1,000 mcg by mouth as needed.     [provider]    Allergies Codeine; Sulfa antibiotics; Azithromycin; Ciprofloxacin; Doxycycline monohydrate; Erythromycin; and Doxycycline  Family History  Problem Relation Age of Onset  . Hypertension Father   . Breast cancer Maternal Aunt 30  . Breast cancer Paternal Aunt   . Breast cancer Paternal Aunt     Social History Social History   Tobacco Use  . Smoking status: Former Research scientist (life sciences)  . Smokeless tobacco: Never Used  Substance Use Topics  . Alcohol use: No  . Drug use: No    Review of Systems  Constitutional: No fever/chills Eyes: No visual changes. ENT: No sore throat. Cardiovascular: Denies chest pain. Respiratory: Denies shortness of breath. Gastrointestinal:  abdominal pain, nausea, no vomiting.  No diarrhea.  No constipation. Genitourinary: Negative for dysuria. Musculoskeletal: Negative for back pain. Skin: Negative for rash. Neurological: syncope   ____________________________________________   PHYSICAL EXAM:  VITAL SIGNS: ED Triage Vitals [04/20/17 0019]  Enc Vitals Group     BP (!) 141/95     Pulse Rate (!) 106     Resp 18     Temp 98.4 F (36.9 C)     Temp Source Oral     SpO2 100 %     Weight 171 lb (77.6 kg)     Height 5\' 4"  (1.626 m)     Head Circumference      Peak Flow      Pain Score 7     Pain Loc      Pain Edu?      Excl. in [place]?     Constitutional: Alert and oriented. Well appearing and in moderate distress. Eyes: Conjunctivae are normal. PERRL. EOMI. Head: Atraumatic. Nose: No congestion/rhinnorhea. Mouth/Throat: Mucous membranes are moist.  Oropharynx non-erythematous. Cardiovascular: Normal rate, regular rhythm. Grossly  normal heart sounds.  Good peripheral circulation. Respiratory: Normal respiratory effort.  No retractions. Lungs CTAB. Gastrointestinal: Soft and nontender. No distention.positive bowel sounds Musculoskeletal: No lower extremity tenderness nor edema.   Neurologic:  Normal speech and language.  Generalized weakness but cranial nerves II through XII are grossly intact Skin:  Skin is warm, dry and intact. Marland Kitchen Psychiatric: Mood and affect are normal.   ____________________________________________   LABS (all labs ordered are listed, but only abnormal results are displayed)  Labs Reviewed  COMPREHENSIVE METABOLIC PANEL - Abnormal; Notable for the following components:      Result Value   Sodium 134 (*)  Potassium 3.1 (*)    Chloride 100 (*)    CO2 19 (*)    Glucose, Bld 134 (*)    All other components within normal limits  URINALYSIS, COMPLETE (UACMP) WITH MICROSCOPIC - Abnormal; Notable for the following components:   Color, Urine STRAW (*)    APPearance CLEAR (*)    Ketones, ur 5 (*)    All other components within normal limits  CBC WITH DIFFERENTIAL/PLATELET - Abnormal; Notable for the following components:   WBC 15.4 (*)    Neutro Abs 11.8 (*)    All other components within normal limits  LIPASE, BLOOD  TROPONIN I   ____________________________________________  EKG  ED ECG REPORT #1 I, Loney Hering, the attending physician, personally viewed and interpreted this ECG.   Date: 04/20/2017  EKG Time: 0025  Rate: 55  Rhythm: sinus bradycardia  Axis: normal  Intervals:none  ST&T Change: st depression diffusely  ED ECG REPORT #2 I, Loney Hering, the attending physician, personally viewed and interpreted this ECG.   Date: 04/20/2017  EKG Time: 0025  Rate: 81  Rhythm: normal sinus rhythm  Axis: normal  Intervals:prolonged qtc  ST&T Change: none  ED ECG REPORT #3 I, Loney Hering, the attending physician, personally viewed and interpreted this  ECG.   Date: 04/20/2017  EKG Time: 0044  Rate: 93  Rhythm: normal sinus rhythm  Axis: normal  Intervals:none  ST&T Change: none    ____________________________________________  RADIOLOGY  ED MD interpretation:  CXR: NAD  CT abd and pelvis: Mild fluid-filled distention of jejunal loops suspicious for mild small bowel enteritis, no mechanical obstruction.  Official radiology report(s): Ct Abdomen Pelvis W Contrast  Result Date: 04/20/2017 CLINICAL DATA:  Midsternal chest pain radiating to the back for several weeks accompanied by nausea and diaphoresis. EXAM: CT ABDOMEN AND PELVIS WITH CONTRAST TECHNIQUE: Multidetector CT imaging of the abdomen and pelvis was performed using the standard protocol following bolus administration of intravenous contrast. CONTRAST:  187mL ISOVUE-300 IOPAMIDOL (ISOVUE-300) INJECTION 61% COMPARISON:  04/06/2009 FINDINGS: Lower chest: Normal heart size without pericardial effusion. Scarring or atelectasis at the lung bases. No effusion or pneumothorax. Hepatobiliary: Status post cholecystectomy. Homogeneous appearance of the liver without space-occupying mass or biliary dilatation. Pancreas: No ductal dilatation, mass nor inflammation. Spleen: Normal Adrenals/Urinary Tract: Normal bilateral adrenal glands and kidneys. No hydroureteronephrosis. Physiologic distention of the urinary bladder without focal mural thickening or calculus. Stomach/Bowel: Fluid and food distended stomach with normal small bowel rotation. Mild to moderate fluid-filled distention of small bowel loops in the left hemiabdomen without mechanical source of obstruction raising the possibility of small bowel enteritis. Ileal loops are relatively decompressed in appearance. Normal terminal ileum. No large bowel obstruction. Scattered colonic diverticulosis along the distal descending proximal sigmoid colon without acute diverticulitis. Normal appearing appendix. Vascular/Lymphatic: No significant  vascular findings are present. No enlarged abdominal or pelvic lymph nodes. Reproductive: Status post hysterectomy. No adnexal masses. Other: No abdominal wall hernia or abnormality. No abdominopelvic ascites. Musculoskeletal: No acute or significant osseous findings. IMPRESSION: Mild fluid-filled distention of jejunal loops suspicious for mild small bowel enteritis. No mechanical obstruction is noted. Electronically Signed   By: Ashley Royalty M.D.   On: 04/20/2017 04:01   Dg Chest Port 1 View  Result Date: 04/20/2017 CLINICAL DATA:  Mid chest pain. EXAM: PORTABLE CHEST 1 VIEW COMPARISON:  Radiograph 05/30/2015 FINDINGS: Multiple overlying monitoring devices overlie the chest.The cardiomediastinal contours are normal. The lungs are clear. Pulmonary vasculature  is normal. No consolidation, pleural effusion, or pneumothorax. No acute osseous abnormalities are seen. IMPRESSION: No acute pulmonary process. Electronically Signed   By: Jeb Levering M.D.   On: 04/20/2017 01:18    ____________________________________________   PROCEDURES  Procedure(s) performed: None  Procedures  Critical Care performed: No  ____________________________________________   INITIAL IMPRESSION / ASSESSMENT AND PLAN / ED COURSE  As part of my medical decision making, I reviewed the following data within the electronic MEDICAL RECORD NUMBER Notes from prior ED visits and [place] Controlled Substance Database   This is a 47 year old female who comes into the hospital today with some epigastric pain.  While the patient was in triage she had a syncopal episode and was found to have some asystole on EKG.  My differential diagnosis includes sick sinus syndrome, pancreatitis, acute coronary syndrome, myocardial infarction.  The patient was placed on the cardiac monitor with the pads in place in the event that she has another asystolic event.  The patient did have some blood work drawn to include a CBC CMP lipase and troponin.   The patient also received a chest x-ray.  I gave her a dose of fentanyl 50 mcg IV for pain and a liter of normal saline.  The patient will be reassessed.     The patient did receive a liter of normal saline as well as 2 doses of fentanyl for pain.  I did send the patient for a CT scan which showed some mild small bowel enteritis.  Since the patient did have the episode of syncope with asystole or a prolonged sinus pause I will admit the patient to the hospitalist service for further evaluation of her symptoms. ____________________________________________   FINAL CLINICAL IMPRESSION(S) / ED DIAGNOSES  Final diagnoses:  Syncope, unspecified syncope type  Epigastric pain  Enteritis     ED Discharge Orders    None       Note:  This document was prepared using Dragon voice recognition software and may include unintentional dictation errors.    Loney Hering, MD 04/20/17 [phone]

## 2017-04-20 NOTE — Progress Notes (Signed)
Pt is requesting a [employer]. Doctor Posey Pronto was notified and ordered below the knee ted hose.

## 2017-04-20 NOTE — Progress Notes (Addendum)
Pt complained  feeling about passing out. Vitas: BP 92/54, Hr 58, T98, RR 20. Page Doctor Manuella Ghazi called and ordered to keep pt on bedrest and keep the D 5 normal saline with 20 mEQ continuously. Will continue to monitor.

## 2017-04-20 NOTE — ED Notes (Signed)
Call to 2A to give report, floor talking to house coverage about pt not coming to floor bed instead ICU

## 2017-04-20 NOTE — ED Notes (Addendum)
Upon performing EKG pt becomes pale, diaphoretic with syncope lasting 10-15sec; noted asystole on EKG then SB 30, pt becomes flushed in face, awakens and noted SR 90's; pt then had 2nd syncopal episode; charge nurse notified and pt taken immed to room 6 to be placed on zoll pads and card monitor; Dr Dahlia Client called to room; report given to Raj Janus, RN

## 2017-04-20 NOTE — H&P (Signed)
[facility] NAME: Ariel Bryant    MR#:  [id number]  DATE OF BIRTH:  [birth year]  DATE OF ADMISSION:  04/20/2017  PRIMARY CARE PHYSICIAN: Denton Lank, MD   REQUESTING/REFERRING PHYSICIAN:   CHIEF COMPLAINT:   Chief Complaint  Patient presents with  . Abdominal Pain    HISTORY OF PRESENT ILLNESS: Ariel Bryant  is a 47 y.o. female with a known history per below, also includes POTS, dysautonomia, IBS presenting with 2-week history of epigastric pain radiating into the back associated with nausea, decreased p.o. intake, chills, sweating, generally ill feeling, fatigue, generalized weakness, dizziness, in the emergency room patient had 2 syncopal episodes, once in triage while hooked up to EKG monitoring-tracings noted for ?  A few seconds of asystole followed by transient junctional rhythm, then sinus rhythm during the first syncopal event, in the emergency room patient was found to have ketones on urine, white count 15,000, potassium 3.1, sodium 134, chloride 100, CT abdomen noted for mild fluid-filled distended jejunal loops concerning for small bowel enteritis, chest x-ray was negative, patient evaluated in the emergency room, diaphoretic, ill-appearing, noted epigastric tenderness on palpation without guarding, patient is now been admitted for acute small bowel infectious enteritis, syncopal events secondary to arrhythmia with noted history of POTS/dysautonomia.  PAST MEDICAL HISTORY:   Past Medical History:  Diagnosis Date  . Anxiety   . GERD (gastroesophageal reflux disease)   . PTSD (post-traumatic stress disorder)   . Sinus tachycardia     PAST SURGICAL HISTORY:  Past Surgical History:  Procedure Laterality Date  . BREAST BIOPSY Left 12/28/2016   Affirm Biopsy- path pending. RIbbon clip  . CESAREAN SECTION     NSD x 3 followed by C/S for 8 + pounds breech  . CHOLECYSTECTOMY    . LAPAROSCOPIC OOPHERECTOMY    . VAGINAL  HYSTERECTOMY     had BTL, then VH, later developed ovarian mass and had ooph at 47 yo    SOCIAL HISTORY:  Social History   Tobacco Use  . Smoking status: Former Research scientist (life sciences)  . Smokeless tobacco: Never Used  Substance Use Topics  . Alcohol use: No    FAMILY HISTORY:  Family History  Problem Relation Age of Onset  . Hypertension Father   . Breast cancer Maternal Aunt 30  . Breast cancer Paternal Aunt   . Breast cancer Paternal Aunt     DRUG ALLERGIES:  Allergies  Allergen Reactions  . Codeine Shortness Of Breath  . Sulfa Antibiotics Anaphylaxis  . Azithromycin Other (See Comments)  . Ciprofloxacin Other (See Comments)  . Doxycycline Monohydrate Other (See Comments)  . Erythromycin Other (See Comments)  . Doxycycline Rash    REVIEW OF SYSTEMS:   CONSTITUTIONAL: No fever, +fatigue, weakness.  EYES: No blurred or double vision.  EARS, NOSE, AND THROAT: No tinnitus or ear pain.  RESPIRATORY: No cough, shortness of breath, wheezing or hemoptysis.  CARDIOVASCULAR: No chest pain, orthopnea, edema.  GASTROINTESTINAL: + nausea, no vomiting, diarrhea + abdominal pain.  GENITOURINARY: No dysuria, hematuria.  ENDOCRINE: No polyuria, nocturia,  HEMATOLOGY: No anemia, easy bruising or bleeding SKIN: No rash or lesion. MUSCULOSKELETAL: No joint pain or arthritis.   NEUROLOGIC: No tingling, numbness, weakness.  PSYCHIATRY: No anxiety or depression.   MEDICATIONS AT HOME:  Prior to Admission medications   Medication Sig Start Date End Date Taking? Authorizing Provider  alprazolam Duanne Moron) 2 MG tablet Take 2 mg by mouth 3 (three) times daily.  08/17/16  Yes [provider]  Ascorbic Acid (VITAMIN C) 1000 MG tablet Take 1,000 mg by mouth daily.   Yes [provider]  Biotin 1 MG CAPS Take 1 capsule by mouth daily.   Yes [provider]  buPROPion (WELLBUTRIN XL) 150 MG 24 hr tablet Take 2 tablets by mouth daily. 03/02/17  Yes [provider]  Calcium  Carb-Cholecalciferol (CALCIUM + D3) 600-200 MG-UNIT TABS Take 1 tablet by mouth 2 (two) times daily.    Yes [provider]  fluticasone (FLONASE) 50 MCG/ACT nasal spray Place 1 spray into both nostrils daily. For Eustachian Tube Dysfunction 05/25/15  Yes [provider]  ketoconazole (NIZORAL) 2 % shampoo APPLY TO DAMP SCALP, LATHER, LEAVE ON FOR 3-5 MINUTES, THEN RINSE 3 TIMES/WEEK UNTIL BETTER 03/09/17  Yes [provider]  omeprazole (PRILOSEC) 40 MG capsule Take 40 mg by mouth daily.   Yes [provider]  acetaminophen (TYLENOL) 325 MG tablet Take 650 mg by mouth as needed for mild pain, moderate pain, fever or headache.     [provider]  acyclovir (ZOVIRAX) 400 MG tablet Take 400 mg by mouth as needed (AS NEEDED FOR COLD SORES).     [provider]  busPIRone (BUSPAR) 10 MG tablet Take 10 mg by mouth 2 (two) times daily. 04/13/17   [provider]  ibuprofen (ADVIL,MOTRIN) 200 MG tablet Take 800 mg by mouth as needed for fever, headache, mild pain, moderate pain or cramping.     [provider]  ipratropium (ATROVENT HFA) 17 MCG/ACT inhaler Inhale 2 puffs into the lungs every 4 (four) hours as needed for wheezing.    [provider]  Magnesium 500 MG TABS Take 1 tablet by mouth daily as needed.     [provider]  potassium gluconate 595 (99 K) MG TABS tablet Take 595 mg by mouth 3 (three) times a week.    [provider]  vitamin B-12 (CYANOCOBALAMIN) 1000 MCG tablet Take 1,000 mcg by mouth as needed.     [provider]      PHYSICAL EXAMINATION:   VITAL SIGNS: Blood pressure 118/84, pulse 96, temperature 98.4 F (36.9 C), temperature source Oral, resp. rate 17, height 5\' 4"  (1.626 m), weight 77.6 kg (171 lb), SpO2 99 %.  GENERAL:  47 y.o.-year-old patient lying in the bed with mild acute distress.  Diaphoretic, ill-appearing EYES: Pupils equal, round, reactive to light and  accommodation. No scleral icterus. Extraocular muscles intact.  HEENT: Head atraumatic, normocephalic. Oropharynx and nasopharynx clear.  NECK:  Supple, no jugular venous distention. No thyroid enlargement, no tenderness.  LUNGS: Normal breath sounds bilaterally, no wheezing, rales,rhonchi or crepitation. No use of accessory muscles of respiration.  CARDIOVASCULAR: S1, S2 normal. No murmurs, rubs, or gallops.  ABDOMEN: Epigastric tenderness, nondistended. Bowel sounds present. No organomegaly or mass.  EXTREMITIES: No pedal edema, cyanosis, or clubbing.  NEUROLOGIC: Cranial nerves II through XII are intact. MAES. Gait not checked.  PSYCHIATRIC: The patient is alert and oriented x 3.  SKIN: No obvious rash, lesion, or ulcer.   LABORATORY PANEL:   CBC Recent Labs  Lab 04/20/17 0035  WBC 15.4*  HGB 14.3  HCT 43.5  PLT 365  MCV 84.4  MCH 27.8  MCHC 32.9  RDW 12.9  LYMPHSABS 2.5  MONOABS 0.9  EOSABS 0.1  BASOSABS 0.1   ------------------------------------------------------------------------------------------------------------------  Chemistries  Recent Labs  Lab 04/20/17 0035  NA 134*  K 3.1*  CL 100*  CO2 19*  GLUCOSE 134*  BUN 10  CREATININE 0.70  CALCIUM 9.4  AST 28  ALT 22  ALKPHOS 64  BILITOT 0.6   ------------------------------------------------------------------------------------------------------------------ estimated creatinine clearance is 87.7 mL/min (by C-G formula based on SCr of 0.7 mg/dL). ------------------------------------------------------------------------------------------------------------------ No results for input(s): TSH, T4TOTAL, T3FREE, THYROIDAB in the last 72 hours.  Invalid input(s): FREET3   Coagulation profile No results for input(s): INR, PROTIME in the last 168 hours. ------------------------------------------------------------------------------------------------------------------- No results for input(s): DDIMER in the last 72  hours. -------------------------------------------------------------------------------------------------------------------  Cardiac Enzymes Recent Labs  Lab 04/20/17 0035  TROPONINI <0.03   ------------------------------------------------------------------------------------------------------------------ Invalid input(s): POCBNP  ---------------------------------------------------------------------------------------------------------------  Urinalysis    Component Value Date/Time   COLORURINE STRAW (A) 04/20/2017 0410   APPEARANCEUR CLEAR (A) 04/20/2017 0410   APPEARANCEUR Clear 08/24/2011 0511   LABSPEC 1.023 04/20/2017 0410   LABSPEC 1.008 08/24/2011 0511   PHURINE 8.0 04/20/2017 0410   GLUCOSEU NEGATIVE 04/20/2017 0410   GLUCOSEU Negative 08/24/2011 0511   HGBUR NEGATIVE 04/20/2017 0410   BILIRUBINUR NEGATIVE 04/20/2017 0410   BILIRUBINUR Negative 08/24/2011 0511   KETONESUR 5 (A) 04/20/2017 0410   PROTEINUR NEGATIVE 04/20/2017 0410   NITRITE NEGATIVE 04/20/2017 0410   LEUKOCYTESUR NEGATIVE 04/20/2017 0410   LEUKOCYTESUR Trace 08/24/2011 0511     RADIOLOGY: Ct Abdomen Pelvis W Contrast  Result Date: 04/20/2017 CLINICAL DATA:  Midsternal chest pain radiating to the back for several weeks accompanied by nausea and diaphoresis. EXAM: CT ABDOMEN AND PELVIS WITH CONTRAST TECHNIQUE: Multidetector CT imaging of the abdomen and pelvis was performed using the standard protocol following bolus administration of intravenous contrast. CONTRAST:  182mL ISOVUE-300 IOPAMIDOL (ISOVUE-300) INJECTION 61% COMPARISON:  04/06/2009 FINDINGS: Lower chest: Normal heart size without pericardial effusion. Scarring or atelectasis at the lung bases. No effusion or pneumothorax. Hepatobiliary: Status post cholecystectomy. Homogeneous appearance of the liver without space-occupying mass or biliary dilatation. Pancreas: No ductal dilatation, mass nor inflammation. Spleen: Normal Adrenals/Urinary Tract:  Normal bilateral adrenal glands and kidneys. No hydroureteronephrosis. Physiologic distention of the urinary bladder without focal mural thickening or calculus. Stomach/Bowel: Fluid and food distended stomach with normal small bowel rotation. Mild to moderate fluid-filled distention of small bowel loops in the left hemiabdomen without mechanical source of obstruction raising the possibility of small bowel enteritis. Ileal loops are relatively decompressed in appearance. Normal terminal ileum. No large bowel obstruction. Scattered colonic diverticulosis along the distal descending proximal sigmoid colon without acute diverticulitis. Normal appearing appendix. Vascular/Lymphatic: No significant vascular findings are present. No enlarged abdominal or pelvic lymph nodes. Reproductive: Status post hysterectomy. No adnexal masses. Other: No abdominal wall hernia or abnormality. No abdominopelvic ascites. Musculoskeletal: No acute or significant osseous findings. IMPRESSION: Mild fluid-filled distention of jejunal loops suspicious for mild small bowel enteritis. No mechanical obstruction is noted. Electronically Signed   By: Ashley Royalty M.D.   On: 04/20/2017 04:01   Dg Chest Port 1 View  Result Date: 04/20/2017 CLINICAL DATA:  Mid chest pain. EXAM: PORTABLE CHEST 1 VIEW COMPARISON:  Radiograph 05/30/2015 FINDINGS: Multiple overlying monitoring devices overlie the chest.The cardiomediastinal contours are normal. The lungs are clear. Pulmonary vasculature is normal. No consolidation, pleural effusion, or pneumothorax. No acute osseous abnormalities are seen. IMPRESSION: No acute pulmonary process. Electronically Signed   By: Jeb Levering M.D.   On: 04/20/2017 01:18    EKG: Orders placed or performed during the hospital encounter of 04/20/17  . ED EKG  . ED EKG  . EKG 12-Lead  . EKG 12-Lead  IMPRESSION AND PLAN: 1 acute small bowel infectious enteritis 2 weeks of epigastric pain radiating into the back,  diaphoresis, nausea, ill-appearing, diaphoresis, poor p.o. intake, leukocytosis, CT abdomen noted Admit to telemetry bed, IV Rocephin/Flagyl for 7-day course, blood cultures, IV fluids for rehydration, n.p.o. except for meds for now, serial abdominal exams, Protonix IV twice daily, check lactic acid level, adult pain protocol, and continue close medical monitoring  2 acute arrhythmia Associated with syncopal episodes x2 in the emergency room, EKG with a few seconds of ?  Asystole while hooked up to EKG monitoring followed by transient junctional rhythm then into sinus rhythm, noted history of POTS, and dysautonomia Telemetry floor, rule out acute coronary syndrome with cardiac enzymes x3 sets, check echocardiogram, cardiology to see if expert opinion, fall precautions, IV fluids for rehydration, replete potassium/sodium/chloride, check magnesium level  Echocardiogram from 2017 was a normal study  3 acute syncopal episode  most likely secondary to above Check echocardiogram, carotid Dopplers, and all other plans as stated above  4 acute hypokalemia, hyponatremia, hypochloremia Most likely secondary to enteritis Replete with IV fluids, p.o. potassium, BMP in the morning  5 history of POTS Cardiology to see, check daily orthostatics, IV fluids for rehydration, consider Florinef if orthostatic  6 chronic GERD PPI twice daily if given enteritis   All the records are reviewed and case discussed with ED provider. Management plans discussed with the patient, family and they are in agreement.  CODE STATUS:full Code Status History    This patient does not have a recorded code status. Please follow your organizational policy for patients in this situation.       TOTAL TIME TAKING CARE OF THIS PATIENT: 45 minutes.    Avel Peace Salary M.D on 04/20/2017   Between 7am to 6pm - Pager - [phone]  After 6pm go to [email or web address] - password [email or web address] [facility]  Office   [phone]  CC: Primary care physician; Denton Lank, MD   Note: This dictation was prepared with Dragon dictation along with smaller phrase technology. Any transcriptional errors that result from this process are unintentional.

## 2017-04-20 NOTE — Consult Note (Signed)
d 

## 2017-04-20 NOTE — Progress Notes (Signed)
*  PRELIMINARY RESULTS* Echocardiogram 2D Echocardiogram has been performed.  Sherrie Sport 04/20/2017, 3:14 PM

## 2017-04-21 ENCOUNTER — Inpatient Hospital Stay: Payer: Medicare Other

## 2017-04-21 DIAGNOSIS — E041 Nontoxic single thyroid nodule: Secondary | ICD-10-CM | POA: Diagnosis not present

## 2017-04-21 DIAGNOSIS — R000 Tachycardia, unspecified: Secondary | ICD-10-CM | POA: Diagnosis not present

## 2017-04-21 DIAGNOSIS — R1013 Epigastric pain: Secondary | ICD-10-CM | POA: Diagnosis not present

## 2017-04-21 DIAGNOSIS — A09 Infectious gastroenteritis and colitis, unspecified: Secondary | ICD-10-CM | POA: Diagnosis not present

## 2017-04-21 DIAGNOSIS — E871 Hypo-osmolality and hyponatremia: Secondary | ICD-10-CM | POA: Diagnosis not present

## 2017-04-21 DIAGNOSIS — K529 Noninfective gastroenteritis and colitis, unspecified: Secondary | ICD-10-CM | POA: Diagnosis not present

## 2017-04-21 DIAGNOSIS — Z90710 Acquired absence of both cervix and uterus: Secondary | ICD-10-CM | POA: Diagnosis not present

## 2017-04-21 DIAGNOSIS — R079 Chest pain, unspecified: Secondary | ICD-10-CM | POA: Diagnosis not present

## 2017-04-21 DIAGNOSIS — R55 Syncope and collapse: Secondary | ICD-10-CM

## 2017-04-21 LAB — CBC
HEMATOCRIT: 36.5 % (ref 35.0–47.0)
HEMOGLOBIN: 12.1 g/dL (ref 12.0–16.0)
MCH: 28.5 pg (ref 26.0–34.0)
MCHC: 33.1 g/dL (ref 32.0–36.0)
MCV: 86 fL (ref 80.0–100.0)
Platelets: 206 10*3/uL (ref 150–440)
RBC: 4.24 MIL/uL (ref 3.80–5.20)
RDW: 13.2 % (ref 11.5–14.5)
WBC: 4.8 10*3/uL (ref 3.6–11.0)

## 2017-04-21 LAB — GLUCOSE, CAPILLARY
GLUCOSE-CAPILLARY: 108 mg/dL — AB (ref 65–99)
GLUCOSE-CAPILLARY: 81 mg/dL (ref 65–99)
Glucose-Capillary: 82 mg/dL (ref 65–99)
Glucose-Capillary: 92 mg/dL (ref 65–99)

## 2017-04-21 LAB — BASIC METABOLIC PANEL
ANION GAP: 6 (ref 5–15)
BUN: 5 mg/dL — ABNORMAL LOW (ref 6–20)
CHLORIDE: 111 mmol/L (ref 101–111)
CO2: 24 mmol/L (ref 22–32)
Calcium: 7.9 mg/dL — ABNORMAL LOW (ref 8.9–10.3)
Creatinine, Ser: 0.57 mg/dL (ref 0.44–1.00)
GFR calc Af Amer: >60 mL/min (ref >60)
GFR calc non Af Amer: >60 mL/min (ref >60)
GLUCOSE: 103 mg/dL — AB (ref 65–99)
POTASSIUM: 3.8 mmol/L (ref 3.5–5.1)
Sodium: 141 mmol/L (ref 135–145)

## 2017-04-21 LAB — HIV ANTIBODY (ROUTINE TESTING W REFLEX): HIV Screen 4th Generation wRfx: NONREACTIVE

## 2017-04-21 MED ORDER — POTASSIUM CHLORIDE CRYS ER 20 MEQ PO TBCR
20.0000 meq | EXTENDED_RELEASE_TABLET | Freq: Two times a day (BID) | ORAL | Status: DC
  Administered 2017-04-21 (×2): 20 meq via ORAL
  Filled 2017-04-21 (×2): qty 1

## 2017-04-21 NOTE — Progress Notes (Signed)
Discharge instructions explained to pt/ verbalized an understanding / iv and tele removed/ transported off unit via wheelchair.  

## 2017-04-21 NOTE — Progress Notes (Addendum)
Pharmacy Electrolyte Monitoring Consult:  Pharmacy consulted to assist in monitoring and replacing electrolytes in this 47 y.o. female admitted on 04/20/2017 with small bowel enteritis. Patient with epigastric pain radiating to back for approximately 2 weeks.   Patient is currently ordered D5NS with 59mEq Potassium/L at 158mL/hr. Magnesium is normal.   Labs:  Sodium (mmol/L)  Date Value  04/21/2017 141  06/13/2015 141  08/24/2011 143   Potassium (mmol/L)  Date Value  04/21/2017 3.8  08/24/2011 3.3 (L)   Magnesium (mg/dL)  Date Value  04/20/2017 2.3   Phosphorus (mg/dL)  Date Value  04/20/2017 2.9   Calcium (mg/dL)  Date Value  04/21/2017 7.9 (L)   Calcium, Total (mg/dL)  Date Value  08/24/2011 8.3 (L)   Albumin (g/dL)  Date Value  04/20/2017 4.5  08/24/2011 3.6    Plan: Patient is ordered a diet an delectrolytes are WNL.Will continue to follow.   Ulice Dash D 04/21/2017 1:45 PM

## 2017-04-21 NOTE — Discharge Summary (Signed)
Ariel Bryant at [place] NAME: Ariel Bryant    MR#:  [id number]  DATE OF BIRTH:  December 14, 1970  DATE OF ADMISSION:  04/20/2017 ADMITTING PHYSICIAN: Gorden Harms, MD  DATE OF DISCHARGE: 04/21/2017  PRIMARY CARE PHYSICIAN: Denton Lank, MD    ADMISSION DIAGNOSIS:  Enteritis [K52.9] Epigastric pain [R10.13] Chest pain [R07.9] Syncope, unspecified syncope type [R55]  DISCHARGE DIAGNOSIS:  Small bowel enteritis Syncopal episode with bradycardia resolved  SECONDARY DIAGNOSIS:   Past Medical History:  Diagnosis Date  . Anxiety   . GERD (gastroesophageal reflux disease)   . PTSD (post-traumatic stress disorder)   . Sinus tachycardia     HOSPITAL COURSE:  Ariel Jiggetts Wilkersonis a 47 y.o.y/o femaleadmitted with acute onset abdominal pain.   *acute small bowel infectious enteritis -  CT abdomen showing enteritis - appreciate GI input - No NSAID's -Patient feels a lot better.  She is able to read regular diet. - they recommend Conservative supportive therapy - stopping antibiotics - Diarrhea slowed down. - Serial abdominal exam and if pain worsens then will need repeat imaging with MR enterogram - outpatient GI f/up and MR enterogram to r/o crohns disease - No endoscopic evaluation while here per GI  *acute arrhythmia Associated with syncopal episodes x2 in the emergency room, EKG with a few seconds of? noted history of POTS, and dysautonomia - d/w Dr Caryl Comes - he recommends no further w/up while here as well and get her better from GI stand point and then outpt eval Echocardiogram from 2017 was a normal study  *acute syncopal episode  most likely secondary to above - normal echocardiogram, carotid Dopplers showing no significant stenosis but shows thyroid nodule,   * Thyroid Nodule - seen on carotid US -Ultrasound thyroid shows1.1 cm left inferior TR 4 nodule meets criteria for follow-up in 1 year. This correlates  with the carotid ultrasound finding.  Discussed with patient will follow-up with primary care physician for further follow-up.  Overall better.  Will discharge to home.  Patient agreeable.  CONSULTS OBTAINED:    DRUG ALLERGIES:   Allergies  Allergen Reactions  . Codeine Shortness Of Breath  . Sulfa Antibiotics Anaphylaxis  . Azithromycin Other (See Comments)  . Ciprofloxacin Other (See Comments)  . Doxycycline Monohydrate Other (See Comments)  . Erythromycin Other (See Comments)  . Doxycycline Rash    DISCHARGE MEDICATIONS:   Allergies as of 04/21/2017      Reactions   Codeine Shortness Of Breath   Sulfa Antibiotics Anaphylaxis   Azithromycin Other (See Comments)   Ciprofloxacin Other (See Comments)   Doxycycline Monohydrate Other (See Comments)   Erythromycin Other (See Comments)   Doxycycline Rash      Medication List    STOP taking these medications   ibuprofen 200 MG tablet Commonly known as:  ADVIL,MOTRIN     TAKE these medications   acetaminophen 325 MG tablet Commonly known as:  TYLENOL Take 650 mg by mouth as needed for mild pain, moderate pain, fever or headache.   acyclovir 400 MG tablet Commonly known as:  ZOVIRAX Take 400 mg by mouth as needed (AS NEEDED FOR COLD SORES).   alprazolam 2 MG tablet Commonly known as:  XANAX Take 2 mg by mouth 3 (three) times daily.   Biotin 1 MG Caps Take 1 capsule by mouth daily.   buPROPion 150 MG 24 hr tablet Commonly known as:  WELLBUTRIN XL Take 2 tablets by mouth daily.   busPIRone  10 MG tablet Commonly known as:  BUSPAR Take 10 mg by mouth 2 (two) times daily.   Calcium + D3 600-200 MG-UNIT Tabs Take 1 tablet by mouth 2 (two) times daily.   fluticasone 50 MCG/ACT nasal spray Commonly known as:  FLONASE Place 1 spray into both nostrils daily. For Eustachian Tube Dysfunction   ipratropium 17 MCG/ACT inhaler Commonly known as:  ATROVENT HFA Inhale 2 puffs into the lungs every 4 (four) hours as  needed for wheezing.   ketoconazole 2 % shampoo Commonly known as:  NIZORAL APPLY TO DAMP SCALP, LATHER, LEAVE ON FOR 3-5 MINUTES, THEN RINSE 3 TIMES/WEEK UNTIL BETTER   Magnesium 500 MG Tabs Take 1 tablet by mouth daily as needed.   omeprazole 40 MG capsule Commonly known as:  PRILOSEC Take 40 mg by mouth daily.   potassium gluconate 595 (99 K) MG Tabs tablet Take 595 mg by mouth 3 (three) times a week.   vitamin B-12 1000 MCG tablet Commonly known as:  CYANOCOBALAMIN Take 1,000 mcg by mouth as needed.   vitamin C 1000 MG tablet Take 1,000 mg by mouth daily.       If you experience worsening of your admission symptoms, develop shortness of breath, life threatening emergency, suicidal or homicidal thoughts you must seek medical attention immediately by calling 911 or calling your MD immediately  if symptoms less severe.  You Must read complete instructions/literature along with all the possible adverse reactions/side effects for all the Medicines you take and that have been prescribed to you. Take any new Medicines after you have completely understood and accept all the possible adverse reactions/side effects.   Please note  You were cared for by a hospitalist during your hospital stay. If you have any questions about your discharge medications or the care you received while you were in the hospital after you are discharged, you can call the unit and asked to speak with the hospitalist on call if the hospitalist that took care of you is not available. Once you are discharged, your primary care physician will handle any further medical issues. Please note that NO REFILLS for any discharge medications will be authorized once you are discharged, as it is imperative that you return to your primary care physician (or establish a relationship with a primary care physician if you do not have one) for your aftercare needs so that they can reassess your need for medications and monitor your  lab values. Today   SUBJECTIVE   Tolerating regular diet.  Sinus rhythm on telemetry.  Blood pressure better.  Diarrhea decreasing.  VITAL SIGNS:  Blood pressure 117/77, pulse (!) 103, temperature 97.9 F (36.6 C), temperature source Oral, resp. rate 17, height 5\' 4"  (1.626 m), weight 76.5 kg (168 lb 9.6 oz), SpO2 96 %.  I/O:    Intake/Output Summary (Last 24 hours) at 04/21/2017 1418 Last data filed at 04/21/2017 1257 Gross per 24 hour  Intake 590 ml  Output 2900 ml  Net -2310 ml    PHYSICAL EXAMINATION:  GENERAL:  47 y.o.-year-old patient lying in the bed with no acute distress.  EYES: Pupils equal, round, reactive to light and accommodation. No scleral icterus. Extraocular muscles intact.  HEENT: Head atraumatic, normocephalic. Oropharynx and nasopharynx clear.  NECK:  Supple, no jugular venous distention. No thyroid enlargement, no tenderness.  LUNGS: Normal breath sounds bilaterally, no wheezing, rales,rhonchi or crepitation. No use of accessory muscles of respiration.  CARDIOVASCULAR: S1, S2 normal. No murmurs, rubs, or gallops.  ABDOMEN: Soft, non-tender, non-distended. Bowel sounds present. No organomegaly or mass.  EXTREMITIES: No pedal edema, cyanosis, or clubbing.  NEUROLOGIC: Cranial nerves II through XII are intact. Muscle strength 5/5 in all extremities. Sensation intact. Gait not checked.  PSYCHIATRIC: The patient is alert and oriented x 3.  SKIN: No obvious rash, lesion, or ulcer.   DATA REVIEW:   CBC  Recent Labs  Lab 04/21/17 0425  WBC 4.8  HGB 12.1  HCT 36.5  PLT 206    Chemistries  Recent Labs  Lab 04/20/17 0035 04/20/17 0841 04/21/17 0425  NA 134*  --  141  K 3.1*  --  3.8  CL 100*  --  111  CO2 19*  --  24  GLUCOSE 134*  --  103*  BUN 10  --  5*  CREATININE 0.70 0.66 0.57  CALCIUM 9.4  --  7.9*  MG  --  2.3  --   AST 28  --   --   ALT 22  --   --   ALKPHOS 64  --   --   BILITOT 0.6  --   --     Microbiology Results   Recent  Results (from the past 240 hour(s))  CULTURE, BLOOD (ROUTINE X 2) w Reflex to ID Panel     Status: None (Preliminary result)   Collection Time: 04/20/17  5:46 AM  Result Value Ref Range Status   Specimen Description BLOOD LEFT AC  Final   Special Requests   Final    BOTTLES DRAWN AEROBIC AND ANAEROBIC Blood Culture adequate volume   Culture   Final    NO GROWTH < 24 HOURS Performed at [facility], [address] [place], [place] [address]    Report Status PENDING  Incomplete  CULTURE, BLOOD (ROUTINE X 2) w Reflex to ID Panel     Status: None (Preliminary result)   Collection Time: 04/20/17  5:46 AM  Result Value Ref Range Status   Specimen Description BLOOD RIGHT AC  Final   Special Requests   Final    BOTTLES DRAWN AEROBIC AND ANAEROBIC Blood Culture results may not be optimal due to an excessive volume of blood received in culture bottles   Culture   Final    NO GROWTH < 24 HOURS Performed at [facility], [address] [place], [place] [address]    Report Status PENDING  Incomplete    RADIOLOGY:  Ct Abdomen Pelvis W Contrast  Result Date: 04/20/2017 CLINICAL DATA:  Midsternal chest pain radiating to the back for several weeks accompanied by nausea and diaphoresis. EXAM: CT ABDOMEN AND PELVIS WITH CONTRAST TECHNIQUE: Multidetector CT imaging of the abdomen and pelvis was performed using the standard protocol following bolus administration of intravenous contrast. CONTRAST:  161mL ISOVUE-300 IOPAMIDOL (ISOVUE-300) INJECTION 61% COMPARISON:  04/06/2009 FINDINGS: Lower chest: Normal heart size without pericardial effusion. Scarring or atelectasis at the lung bases. No effusion or pneumothorax. Hepatobiliary: Status post cholecystectomy. Homogeneous appearance of the liver without space-occupying mass or biliary dilatation. Pancreas: No ductal dilatation, mass nor inflammation. Spleen: Normal Adrenals/Urinary Tract: Normal bilateral adrenal glands and kidneys. No  hydroureteronephrosis. Physiologic distention of the urinary bladder without focal mural thickening or calculus. Stomach/Bowel: Fluid and food distended stomach with normal small bowel rotation. Mild to moderate fluid-filled distention of small bowel loops in the left hemiabdomen without mechanical source of obstruction raising the possibility of small bowel enteritis. Ileal loops are relatively decompressed in appearance. Normal terminal ileum.  No large bowel obstruction. Scattered colonic diverticulosis along the distal descending proximal sigmoid colon without acute diverticulitis. Normal appearing appendix. Vascular/Lymphatic: No significant vascular findings are present. No enlarged abdominal or pelvic lymph nodes. Reproductive: Status post hysterectomy. No adnexal masses. Other: No abdominal wall hernia or abnormality. No abdominopelvic ascites. Musculoskeletal: No acute or significant osseous findings. IMPRESSION: Mild fluid-filled distention of jejunal loops suspicious for mild small bowel enteritis. No mechanical obstruction is noted. Electronically Signed   By: Ashley Royalty M.D.   On: 04/20/2017 04:01   US Carotid Bilateral  Result Date: 04/20/2017 CLINICAL DATA:  Syncope EXAM: BILATERAL CAROTID DUPLEX ULTRASOUND TECHNIQUE: Pearline Cables scale imaging, color Doppler and duplex ultrasound were performed of bilateral carotid and vertebral arteries in the neck. COMPARISON:  None. FINDINGS: Criteria: Quantification of carotid stenosis is based on velocity parameters that correlate the residual internal carotid diameter with NASCET-based stenosis levels, using the diameter of the distal internal carotid lumen as the denominator for stenosis measurement. The following velocity measurements were obtained: RIGHT ICA:  83 cm/sec CCA:  81 cm/sec SYSTOLIC ICA/CCA RATIO:  1.0 DIASTOLIC ICA/CCA RATIO:  1.8 ECA:  99 cm/sec LEFT ICA:  81 cm/sec CCA:  95 cm/sec SYSTOLIC ICA/CCA RATIO:  0.9 DIASTOLIC ICA/CCA RATIO:  1.4 ECA:  90  cm/sec RIGHT CAROTID ARTERY: Little if any plaque in the bulb. Low resistance internal carotid Doppler pattern. Tachycardia. RIGHT VERTEBRAL ARTERY:  Antegrade. LEFT CAROTID ARTERY: Little if any plaque in the bulb. Low resistance internal carotid Doppler pattern. LEFT VERTEBRAL ARTERY:  Antegrade. There is a hypoechoic nodule in the left lobe measuring 1.1 x 0.6 x 1.0 cm. IMPRESSION: Less than 50% stenosis in the right and left internal carotid arteries. 1.1 cm hypoechoic left thyroid nodule. Complete thyroid ultrasound is recommended. This nodule meets criteria for annual follow-up. Electronically Signed   By: Marybelle Killings M.D.   On: 04/20/2017 12:24   Dg Chest Port 1 View  Result Date: 04/20/2017 CLINICAL DATA:  Mid chest pain. EXAM: PORTABLE CHEST 1 VIEW COMPARISON:  Radiograph 05/30/2015 FINDINGS: Multiple overlying monitoring devices overlie the chest.The cardiomediastinal contours are normal. The lungs are clear. Pulmonary vasculature is normal. No consolidation, pleural effusion, or pneumothorax. No acute osseous abnormalities are seen. IMPRESSION: No acute pulmonary process. Electronically Signed   By: Jeb Levering M.D.   On: 04/20/2017 01:18   US Thyroid  Result Date: 04/21/2017 CLINICAL DATA:  Thyroid nodule by carotid ultrasound EXAM: THYROID ULTRASOUND TECHNIQUE: Ultrasound examination of the thyroid gland and adjacent soft tissues was performed. COMPARISON:  04/20/2017 carotid ultrasound FINDINGS: Parenchymal Echotexture: Normal Isthmus: 4 mm Right lobe: 5.1 x 1.7 x 1.7 cm Left lobe: 5.0 x 1.8 x 1.6 cm _________________________________________________________ Estimated total number of nodules >/= 1 cm: 1 Number of spongiform nodules >/=  2 cm not described below (TR1): 0 Number of mixed cystic and solid nodules >/= 1.5 cm not described below ([place]): 0 _________________________________________________________ Nodule # 3: Location: Left; Inferior Maximum size: 1.1 cm; Other 2 dimensions: 1.0 x  0.6 cm Composition: solid/almost completely solid (2) Echogenicity: hypoechoic (2) Shape: not taller-than-wide (0) Margins: smooth (0) Echogenic foci: none (0) ACR TI-RADS total points: 4. ACR TI-RADS risk category: TR4 (4-6 points). ACR TI-RADS recommendations: *Given size (>/= 1 - 1.4 cm) and appearance, a follow-up ultrasound in 1 year should be considered based on TI-RADS criteria. _________________________________________________________ There are additional subcentimeter hypoechoic nodules in the right thyroid lower pole measuring 6 mm or less in size. These would not meet criteria  for any biopsy or follow-up. No adenopathy IMPRESSION: 1.1 cm left inferior TR 4 nodule meets criteria for follow-up in 1 year. This correlates with the carotid ultrasound finding. The above is in keeping with the ACR TI-RADS recommendations - J Am Coll Radiol 2017;14:587-595. Electronically Signed   By: Jerilynn Mages.  Shick M.D.   On: 04/21/2017 11:49     Management plans discussed with the patient, family and they are in agreement.  CODE STATUS:     Code Status Orders  (From admission, onward)        Start     Ordered   04/20/17 0732  Full code  Continuous     04/20/17 0731    Code Status History    Date Active Date Inactive Code Status Order ID Comments User Context   This patient has a current code status but no historical code status.      TOTAL TIME TAKING CARE OF THIS PATIENT: *40* minutes.    Fritzi Mandes M.D on 04/21/2017 at 2:18 PM  Between 7am to 6pm - Pager - [phone] After 6pm go to [email or web address] - password [email or web address] [facility]  Office  [phone]  CC: Primary care physician; Denton Lank, MD

## 2017-04-21 NOTE — Progress Notes (Signed)
Ariel Bryant , MD [address], [place], [place], [place], [address] [address], [place], [place], [place], [address] Phone: [phone]  Fax: [phone]   Ariel Bryant is being followed for enteritis  Day 1 of follow up   Subjective: Feels much better, no pain   Objective: Vital signs in last 24 hours: Vitals:   04/20/17 1641 04/20/17 2014 04/21/17 0406 04/21/17 0810  BP: 114/73 117/76 117/70 117/77  Pulse: 94 91 88 (!) 103  Resp:  17 17   Temp:  98.5 F (36.9 C) 98.1 F (36.7 C) 97.9 F (36.6 C)  TempSrc:  Oral Oral Oral  SpO2: 100% 99% 100% 96%  Weight:      Height:       Weight change:   Intake/Output Summary (Last 24 hours) at 04/21/2017 1159 Last data filed at 04/21/2017 7371 Gross per 24 hour  Intake 690 ml  Output 1900 ml  Net -1210 ml     Exam: Heart:: Regular rate and rhythm, S1S2 present or without murmur or extra heart sounds Lungs: normal, clear to auscultation and clear to auscultation and percussion Abdomen: soft, nontender, normal bowel sounds   Lab Results: @LABTEST2 @ Micro Results: Recent Results (from the past 240 hour(s))  CULTURE, BLOOD (ROUTINE X 2) w Reflex to ID Panel     Status: None (Preliminary result)   Collection Time: 04/20/17  5:46 AM  Result Value Ref Range Status   Specimen Description BLOOD LEFT AC  Final   Special Requests   Final    BOTTLES DRAWN AEROBIC AND ANAEROBIC Blood Culture adequate volume   Culture   Final    NO GROWTH < 24 HOURS Performed at [facility] [address] [place], [place] [address]    Report Status PENDING  Incomplete  CULTURE, BLOOD (ROUTINE X 2) w Reflex to ID Panel     Status: None (Preliminary result)   Collection Time: 04/20/17  5:46 AM  Result Value Ref Range Status   Specimen Description BLOOD RIGHT AC  Final   Special Requests   Final    BOTTLES DRAWN AEROBIC AND ANAEROBIC Blood Culture results may not be optimal due to an excessive volume of blood received in  culture bottles   Culture   Final    NO GROWTH < 24 HOURS Performed at [facility], [address] [place], [place] [address]    Report Status PENDING  Incomplete   Studies/Results: Ct Abdomen Pelvis W Contrast  Result Date: 04/20/2017 CLINICAL DATA:  Midsternal chest pain radiating to the back for several weeks accompanied by nausea and diaphoresis. EXAM: CT ABDOMEN AND PELVIS WITH CONTRAST TECHNIQUE: Multidetector CT imaging of the abdomen and pelvis was performed using the standard protocol following bolus administration of intravenous contrast. CONTRAST:  129mL ISOVUE-300 IOPAMIDOL (ISOVUE-300) INJECTION 61% COMPARISON:  04/06/2009 FINDINGS: Lower chest: Normal heart size without pericardial effusion. Scarring or atelectasis at the lung bases. No effusion or pneumothorax. Hepatobiliary: Status post cholecystectomy. Homogeneous appearance of the liver without space-occupying mass or biliary dilatation. Pancreas: No ductal dilatation, mass nor inflammation. Spleen: Normal Adrenals/Urinary Tract: Normal bilateral adrenal glands and kidneys. No hydroureteronephrosis. Physiologic distention of the urinary bladder without focal mural thickening or calculus. Stomach/Bowel: Fluid and food distended stomach with normal small bowel rotation. Mild to moderate fluid-filled distention of small bowel loops in the left hemiabdomen without mechanical source of obstruction raising the possibility of small bowel enteritis. Ileal loops are relatively decompressed in appearance. Normal terminal  ileum. No large bowel obstruction. Scattered colonic diverticulosis along the distal descending proximal sigmoid colon without acute diverticulitis. Normal appearing appendix. Vascular/Lymphatic: No significant vascular findings are present. No enlarged abdominal or pelvic lymph nodes. Reproductive: Status post hysterectomy. No adnexal masses. Other: No abdominal wall hernia or abnormality. No abdominopelvic ascites.  Musculoskeletal: No acute or significant osseous findings. IMPRESSION: Mild fluid-filled distention of jejunal loops suspicious for mild small bowel enteritis. No mechanical obstruction is noted. Electronically Signed   By: Ashley Royalty M.D.   On: 04/20/2017 04:01   US Carotid Bilateral  Result Date: 04/20/2017 CLINICAL DATA:  Syncope EXAM: BILATERAL CAROTID DUPLEX ULTRASOUND TECHNIQUE: Pearline Cables scale imaging, color Doppler and duplex ultrasound were performed of bilateral carotid and vertebral arteries in the neck. COMPARISON:  None. FINDINGS: Criteria: Quantification of carotid stenosis is based on velocity parameters that correlate the residual internal carotid diameter with NASCET-based stenosis levels, using the diameter of the distal internal carotid lumen as the denominator for stenosis measurement. The following velocity measurements were obtained: RIGHT ICA:  83 cm/sec CCA:  81 cm/sec SYSTOLIC ICA/CCA RATIO:  1.0 DIASTOLIC ICA/CCA RATIO:  1.8 ECA:  99 cm/sec LEFT ICA:  81 cm/sec CCA:  95 cm/sec SYSTOLIC ICA/CCA RATIO:  0.9 DIASTOLIC ICA/CCA RATIO:  1.4 ECA:  90 cm/sec RIGHT CAROTID ARTERY: Little if any plaque in the bulb. Low resistance internal carotid Doppler pattern. Tachycardia. RIGHT VERTEBRAL ARTERY:  Antegrade. LEFT CAROTID ARTERY: Little if any plaque in the bulb. Low resistance internal carotid Doppler pattern. LEFT VERTEBRAL ARTERY:  Antegrade. There is a hypoechoic nodule in the left lobe measuring 1.1 x 0.6 x 1.0 cm. IMPRESSION: Less than 50% stenosis in the right and left internal carotid arteries. 1.1 cm hypoechoic left thyroid nodule. Complete thyroid ultrasound is recommended. This nodule meets criteria for annual follow-up. Electronically Signed   By: Marybelle Killings M.D.   On: 04/20/2017 12:24   Dg Chest Port 1 View  Result Date: 04/20/2017 CLINICAL DATA:  Mid chest pain. EXAM: PORTABLE CHEST 1 VIEW COMPARISON:  Radiograph 05/30/2015 FINDINGS: Multiple overlying monitoring devices overlie  the chest.The cardiomediastinal contours are normal. The lungs are clear. Pulmonary vasculature is normal. No consolidation, pleural effusion, or pneumothorax. No acute osseous abnormalities are seen. IMPRESSION: No acute pulmonary process. Electronically Signed   By: Jeb Levering M.D.   On: 04/20/2017 01:18   US Thyroid  Result Date: 04/21/2017 CLINICAL DATA:  Thyroid nodule by carotid ultrasound EXAM: THYROID ULTRASOUND TECHNIQUE: Ultrasound examination of the thyroid gland and adjacent soft tissues was performed. COMPARISON:  04/20/2017 carotid ultrasound FINDINGS: Parenchymal Echotexture: Normal Isthmus: 4 mm Right lobe: 5.1 x 1.7 x 1.7 cm Left lobe: 5.0 x 1.8 x 1.6 cm _________________________________________________________ Estimated total number of nodules >/= 1 cm: 1 Number of spongiform nodules >/=  2 cm not described below (TR1): 0 Number of mixed cystic and solid nodules >/= 1.5 cm not described below ([place]): 0 _________________________________________________________ Nodule # 3: Location: Left; Inferior Maximum size: 1.1 cm; Other 2 dimensions: 1.0 x 0.6 cm Composition: solid/almost completely solid (2) Echogenicity: hypoechoic (2) Shape: not taller-than-wide (0) Margins: smooth (0) Echogenic foci: none (0) ACR TI-RADS total points: 4. ACR TI-RADS risk category: TR4 (4-6 points). ACR TI-RADS recommendations: *Given size (>/= 1 - 1.4 cm) and appearance, a follow-up ultrasound in 1 year should be considered based on TI-RADS criteria. _________________________________________________________ There are additional subcentimeter hypoechoic nodules in the right thyroid lower pole measuring 6 mm or less in size. These would not meet  criteria for any biopsy or follow-up. No adenopathy IMPRESSION: 1.1 cm left inferior TR 4 nodule meets criteria for follow-up in 1 year. This correlates with the carotid ultrasound finding. The above is in keeping with the ACR TI-RADS recommendations - J Am Coll Radiol  2017;14:587-595. Electronically Signed   By: Jerilynn Mages.  Shick M.D.   On: 04/21/2017 11:49   Medications: I have reviewed the patient's current medications. Scheduled Meds: . alprazolam  2 mg Oral QHS  . buPROPion  300 mg Oral Daily  . enoxaparin (LOVENOX) injection  40 mg Subcutaneous Q24H  . fluticasone  1 spray Each Nare Daily  . ondansetron (ZOFRAN) IV  4 mg Intravenous Once  . pantoprazole (PROTONIX) IV  40 mg Intravenous Q12H  . potassium chloride  20 mEq Oral BID  . sodium chloride flush  3 mL Intravenous Q12H   Continuous Infusions: PRN Meds:.acetaminophen **OR** acetaminophen, ALPRAZolam, bisacodyl, ipratropium, ondansetron **OR** ondansetron (ZOFRAN) IV, polyethylene glycol   Assessment: Active Problems:   Enteritis of infectious origin Ariel Bryant is a 47 y.o. y/o female admitted with acute onset abdominal pain. CT scan of the abdomen shows distended stomach, small bowel and enteritis. Very likely etiology is infectious or NSAID related  in origin There is a similar picture of a CT scan back in 2011 which occasionally may suggest crohns disease. Her present admission is complicated with a pause on the EKG with syncope which is being evaluated. Today feels much better .    Plan  1. No NSAID's 2. As an outpatient would consider MR enterogram to r/o crohns disease 3. Advance diet  4. Out patient GI follow up   I will sign off.  Please call me if any further GI concerns or questions.  We would like to thank you for the opportunity to participate in the care of Ariel Bryant.      LOS: 1 day   Ariel Bellows, MD 04/21/2017, 11:59 AM

## 2017-04-25 LAB — CULTURE, BLOOD (ROUTINE X 2)
CULTURE: NO GROWTH
CULTURE: NO GROWTH
SPECIAL REQUESTS: ADEQUATE

## 2017-04-27 ENCOUNTER — Telehealth: Payer: Self-pay

## 2017-04-27 NOTE — Telephone Encounter (Signed)
EMMI Follow-up: Noted on the report that patient had questions about discharge papers and who to contact about changes in condition.  Mailbox was full so unable to leave a message. Will try again.

## 2017-04-28 DIAGNOSIS — G901 Familial dysautonomia [Riley-Day]: Secondary | ICD-10-CM | POA: Diagnosis not present

## 2017-04-28 DIAGNOSIS — E876 Hypokalemia: Secondary | ICD-10-CM | POA: Diagnosis not present

## 2017-04-28 DIAGNOSIS — K529 Noninfective gastroenteritis and colitis, unspecified: Secondary | ICD-10-CM | POA: Diagnosis not present

## 2017-04-29 ENCOUNTER — Ambulatory Visit (INDEPENDENT_AMBULATORY_CARE_PROVIDER_SITE_OTHER): Payer: Medicare Other | Admitting: Internal Medicine

## 2017-04-29 ENCOUNTER — Encounter: Payer: Self-pay | Admitting: Internal Medicine

## 2017-04-29 ENCOUNTER — Ambulatory Visit (INDEPENDENT_AMBULATORY_CARE_PROVIDER_SITE_OTHER): Payer: Medicare Other

## 2017-04-29 VITALS — BP 110/78 | HR 63 | Ht 64.0 in | Wt 167.0 lb

## 2017-04-29 DIAGNOSIS — R002 Palpitations: Secondary | ICD-10-CM | POA: Diagnosis not present

## 2017-04-29 DIAGNOSIS — R55 Syncope and collapse: Secondary | ICD-10-CM | POA: Diagnosis not present

## 2017-04-29 DIAGNOSIS — G901 Familial dysautonomia [Riley-Day]: Secondary | ICD-10-CM | POA: Diagnosis not present

## 2017-04-29 DIAGNOSIS — I493 Ventricular premature depolarization: Secondary | ICD-10-CM | POA: Diagnosis not present

## 2017-04-29 NOTE — Patient Instructions (Signed)
Medication Instructions: - Your physician recommends that you continue on your current medications as directed. Please refer to the Current Medication list given to you today.  Labwork: - none ordered  Procedures/Testing: - Your physician has recommended that you wear a 14 day heart monitor (ZIO).  Follow-Up: - Your physician recommends that you schedule a follow-up appointment in: 1 month with Dr. Caryl Comes.   Any Additional Special Instructions Will Be Listed Below (If Applicable).     If you need a refill on your cardiac medications before your next appointment, please call your pharmacy.

## 2017-04-29 NOTE — Progress Notes (Signed)
Patient Care Team: Denton Lank, MD as PCP - General (Family Medicine)   HPI  Ariel Bryant is a 47 y.o. female Seen in follow-up for symptoms consistent with dysautonomia. She's had significant GI symptoms and she was referred to [facility].  At the last visit we encouraged exercise salt supplementation and consideration of addressing the psychosocial aspects related to her PTSD  She was recently seen in the hospital.  She had presented with GI symptoms diarrhea nausea vomiting and decreased p.o. intake and had syncope.  While being hooked up to the electrocardiogram she had sinus slowing and sinus arrest.  This occurred on the second occasion.  It was associated with her typical prodrome.  She also notes that she has episodes where her heart rate goes quite fast sitting around without any changes in position.  Suddenly can be 120 bpm.   Records and Results Reviewed. As above   Past Medical History:  Diagnosis Date  . Anxiety   . GERD (gastroesophageal reflux disease)   . PTSD (post-traumatic stress disorder)   . Sinus tachycardia     Past Surgical History:  Procedure Laterality Date  . BREAST BIOPSY Left 12/28/2016   Affirm Biopsy- path pending. RIbbon clip  . CESAREAN SECTION     NSD x 3 followed by C/S for 8 + pounds breech  . CHOLECYSTECTOMY    . LAPAROSCOPIC OOPHERECTOMY    . VAGINAL HYSTERECTOMY     had BTL, then VH, later developed ovarian mass and had ooph at 47 yo    Current Outpatient Medications  Medication Sig Dispense Refill  . acetaminophen (TYLENOL) 325 MG tablet Take 650 mg by mouth as needed for mild pain, moderate pain, fever or headache.     Marland Kitchen acyclovir (ZOVIRAX) 400 MG tablet Take 400 mg by mouth as needed (AS NEEDED FOR COLD SORES).     Marland Kitchen alprazolam (XANAX) 2 MG tablet Take 2 mg by mouth 3 (three) times daily.  3  . Ascorbic Acid (VITAMIN C) 1000 MG tablet Take 1,000 mg by mouth daily.    . Biotin 1 MG CAPS Take 1 capsule by mouth daily.      Marland Kitchen buPROPion (WELLBUTRIN XL) 150 MG 24 hr tablet Take 2 tablets by mouth daily.  3  . Calcium Carb-Cholecalciferol (CALCIUM + D3) 600-200 MG-UNIT TABS Take 1 tablet by mouth 2 (two) times daily.     . fluticasone (FLONASE) 50 MCG/ACT nasal spray Place 1 spray into both nostrils daily. For Eustachian Tube Dysfunction    . ipratropium (ATROVENT HFA) 17 MCG/ACT inhaler Inhale 2 puffs into the lungs every 4 (four) hours as needed for wheezing.    . Magnesium 500 MG TABS Take 1 tablet by mouth daily as needed.     Marland Kitchen omeprazole (PRILOSEC) 40 MG capsule Take 40 mg by mouth daily.    . potassium gluconate 595 (99 K) MG TABS tablet Take 595 mg by mouth 3 (three) times a week.    . vitamin B-12 (CYANOCOBALAMIN) 1000 MCG tablet Take 1,000 mcg by mouth as needed.     . dicyclomine (BENTYL) 10 MG capsule Take 10 mg by mouth as needed.     No current facility-administered medications for this visit.     Allergies  Allergen Reactions  . Codeine Shortness Of Breath  . Sulfa Antibiotics Anaphylaxis and Other (See Comments)  . Azithromycin Other (See Comments)  . Ciprofloxacin Other (See Comments)  . Doxycycline Monohydrate Other (See Comments)  .  Erythromycin Other (See Comments)  . Doxycycline Rash      Review of Systems negative except from HPI and PMH  Physical Exam BP 110/78 (BP Location: Right Arm, Patient Position: Sitting, Cuff Size: Normal)   Pulse 63   Ht 5\' 4"  (1.626 m)   Wt 167 lb (75.8 kg)   BMI 28.67 kg/m  Well developed and nourished in no acute distress HENT normal Neck supple with JVP-flat Clear Regular rate and rhythm, no murmurs or gallops Abd-soft with active BS No Clubbing cyanosis edema Skin-warm and dry A & Oriented  Grossly normal sensory and motor function   ECG demonstrates sinus rhythm at 63 Intervals 13/08/44 prolonged rhythm strip demonstrated only sinus rhythm  Assessment and  Plan  Dysautonomia  PVCs  Palpitations  Psychosocial stress/PTSD    Sinus Arrest and syncope   She had numerous questions.  The fact that she had a typical prodromal event associated with sinus slowing infiltrates the mechanism.  There is no olfactory hallucinations to suggest temporal lobe epilepsy.  I am not sure of the mechanism of her abrupt palpitations  We will use a ZIO to seek clarification   We reviewed the value of the prodrome in terms of trying to abort a potential syncope event  Reiterated the importance particularly in the setting of a concomitant illness to stay well hydrated   We spent more than 50% of our >25 min visit in face to face counseling regarding the above         Current medicines are reviewed at length with the patient today .  The patient does not have concerns regarding medicines.

## 2017-05-04 DIAGNOSIS — F331 Major depressive disorder, recurrent, moderate: Secondary | ICD-10-CM | POA: Diagnosis not present

## 2017-05-12 ENCOUNTER — Other Ambulatory Visit
Admission: RE | Admit: 2017-05-12 | Discharge: 2017-05-12 | Disposition: A | Discharge status: Home / Self Care | Payer: Medicare Other | Source: Ambulatory Visit | Attending: Gastroenterology | Admitting: Gastroenterology

## 2017-05-12 ENCOUNTER — Ambulatory Visit (INDEPENDENT_AMBULATORY_CARE_PROVIDER_SITE_OTHER): Payer: Medicare Other | Admitting: Gastroenterology

## 2017-05-12 ENCOUNTER — Encounter: Payer: Self-pay | Admitting: Gastroenterology

## 2017-05-12 ENCOUNTER — Other Ambulatory Visit: Payer: Self-pay

## 2017-05-12 VITALS — BP 107/76 | HR 92 | Temp 97.8°F | Resp 16 | Ht 64.75 in | Wt 169.4 lb

## 2017-05-12 DIAGNOSIS — R002 Palpitations: Secondary | ICD-10-CM | POA: Insufficient documentation

## 2017-05-12 DIAGNOSIS — R110 Nausea: Secondary | ICD-10-CM | POA: Insufficient documentation

## 2017-05-12 DIAGNOSIS — R000 Tachycardia, unspecified: Secondary | ICD-10-CM | POA: Insufficient documentation

## 2017-05-12 DIAGNOSIS — R140 Abdominal distension (gaseous): Secondary | ICD-10-CM | POA: Diagnosis not present

## 2017-05-12 DIAGNOSIS — K529 Noninfective gastroenteritis and colitis, unspecified: Secondary | ICD-10-CM

## 2017-05-12 DIAGNOSIS — E663 Overweight: Secondary | ICD-10-CM | POA: Insufficient documentation

## 2017-05-12 DIAGNOSIS — K602 Anal fissure, unspecified: Secondary | ICD-10-CM | POA: Insufficient documentation

## 2017-05-12 DIAGNOSIS — R102 Pelvic and perineal pain: Secondary | ICD-10-CM | POA: Insufficient documentation

## 2017-05-12 DIAGNOSIS — K219 Gastro-esophageal reflux disease without esophagitis: Secondary | ICD-10-CM | POA: Insufficient documentation

## 2017-05-12 DIAGNOSIS — K5289 Other specified noninfective gastroenteritis and colitis: Secondary | ICD-10-CM

## 2017-05-12 DIAGNOSIS — R109 Unspecified abdominal pain: Secondary | ICD-10-CM | POA: Diagnosis not present

## 2017-05-12 DIAGNOSIS — K589 Irritable bowel syndrome without diarrhea: Secondary | ICD-10-CM | POA: Insufficient documentation

## 2017-05-12 DIAGNOSIS — I10 Essential (primary) hypertension: Secondary | ICD-10-CM | POA: Insufficient documentation

## 2017-05-12 LAB — GASTROINTESTINAL PANEL BY PCR, STOOL (REPLACES STOOL CULTURE)

## 2017-05-12 LAB — C DIFFICILE QUICK SCREEN W PCR REFLEX
C DIFFICILE (CDIFF) TOXIN: NEGATIVE
C Diff antigen: NEGATIVE
C Diff interpretation: NOT DETECTED

## 2017-05-12 NOTE — Progress Notes (Signed)
Ariel Bellows MD, MRCP(U.K) [address]  [place]  [place], [place] [address]  Main: [phone]  Fax: [phone]   Primary Care Physician: Denton Lank, MD  Primary Gastroenterologist:  Dr. Jonathon Bryant   Chief Complaint  Patient presents with  . Abdominal Pain    constipation  . Diarrhea    HPI: Ariel Bryant is a 47 y.o. female    Summary of history :  She is here today for a hospital follow up. I was consulted to see her on 04/20/17 when she was admitted with abdominal pain and diarrhea.   She has been previously been  known to [name] , last seen in 03/2016 by Dr Bayard Hugger and A CT scan in 2011 demonstrated thickened segment of small bowel in the lower mid pelvic region with mesenteric stranding suggestive of enteritis. Normal preceeding CT scans in 2008 and 2006. His impression was that she suffered from dysautonomia.   She presented to the hospital on 04/20/17 wih epigastric pain , in the ER there were some pauses on her EKG and a syncopal event. Ct scan of the abdomen showed small bowel enteritis, dilated stomach with fluid and food . Low potassium on admission at 3.1 . [name] normalized this morning   The symptoms had began just before the hospital admission and were not chronic. NSAID's: daily for many months for general aches and pains.  My impression was that the acute abdominal pain with CT scan showing a  distended stomach, small bowel and enteritis. Very likely etiology was  infectious or NSAID related  in origin This was  a similar picture of a CT scan back in 2011 which occasionally could  suggest crohns disease. Her admission was complicated with a pause on the EKG with syncope. Plan was for outpatient evaluation .    Interval history   3//2019-  05/12/2017  She was seen by her Cardiologist on 04/29/17 - her palpitations are being evaluated.    Pain is better than what it was at the hospital , at her baseline. On and off , occurs a few times a week , each  episode lasts hours , Epigastric, does not radiate. Like a nauseating sensation , no heartburn . Takes omeprazole 40 mg daily on empty stomach , actually helped "tremendously with acid reflux", she has a bowel movement not every day , once every 2-3 days on a good week . This is her baseline and does not know what "normal is".   She says she was told her "stomac is slow" , Changed her weight . Stopped all ibuprofen . She has an external recorder - taken off tomorrow.   She had a colonoscopy some years back and was told was normal .     Current Outpatient Medications  Medication Sig Dispense Refill  . acetaminophen (TYLENOL) 325 MG tablet Take 650 mg by mouth as needed for mild pain, moderate pain, fever or headache.     Marland Kitchen acyclovir (ZOVIRAX) 400 MG tablet Take 400 mg by mouth as needed (AS NEEDED FOR COLD SORES).     Marland Kitchen alprazolam (XANAX) 2 MG tablet Take 2 mg by mouth 3 (three) times daily.  3  . Ascorbic Acid (VITAMIN C) 1000 MG tablet Take 1,000 mg by mouth daily.    . Biotin 1 MG CAPS Take 1 capsule by mouth daily.    Marland Kitchen buPROPion (WELLBUTRIN XL) 150 MG 24 hr tablet Take 2 tablets by mouth daily.  3  . Calcium  Carb-Cholecalciferol (CALCIUM + D3) 600-200 MG-UNIT TABS Take 1 tablet by mouth 2 (two) times daily.     Marland Kitchen dicyclomine (BENTYL) 10 MG capsule Take 10 mg by mouth as needed.    . fluticasone (FLONASE) 50 MCG/ACT nasal spray Place 1 spray into both nostrils daily. For Eustachian Tube Dysfunction    . ipratropium (ATROVENT HFA) 17 MCG/ACT inhaler Inhale 2 puffs into the lungs every 4 (four) hours as needed for wheezing.    . Magnesium 500 MG TABS Take 1 tablet by mouth daily as needed.     Marland Kitchen omeprazole (PRILOSEC) 40 MG capsule Take 40 mg by mouth daily.    . potassium gluconate 595 (99 K) MG TABS tablet Take 595 mg by mouth 3 (three) times a week.    . vitamin B-12 (CYANOCOBALAMIN) 1000 MCG tablet Take 1,000 mcg by mouth as needed.      No current facility-administered medications for  this visit.     Allergies as of 05/12/2017 - Review Complete 05/12/2017  Allergen Reaction Noted  . Codeine Shortness Of Breath 10/05/2014  . Sulfa antibiotics Anaphylaxis and Other (See Comments) 12/14/2013  . Azithromycin Other (See Comments) 06/13/2015  . Ciprofloxacin Other (See Comments) 06/13/2015  . Doxycycline monohydrate Other (See Comments) 06/13/2015  . Erythromycin Other (See Comments) 06/13/2015  . Doxycycline Rash 10/05/2014    ROS:  General: Negative for anorexia, weight loss, fever, chills, fatigue, weakness. ENT: Negative for hoarseness, difficulty swallowing , nasal congestion. CV: Negative for chest pain, angina, palpitations, dyspnea on exertion, peripheral edema.  Respiratory: Negative for dyspnea at rest, dyspnea on exertion, cough, sputum, wheezing.  GI: See history of present illness. GU:  Negative for dysuria, hematuria, urinary incontinence, urinary frequency, nocturnal urination.  Endo: Negative for unusual weight change.    Physical Examination:   BP 107/76   Pulse 92   Temp 97.8 F (36.6 C) (Oral)   Resp 16   Ht 5' 4.75" (1.645 m)   Wt 169 lb 6.4 oz (76.8 kg)   BMI 28.41 kg/m   General: Well-nourished, well-developed in no acute distress.  Eyes: No icterus. Conjunctivae pink. Mouth: Oropharyngeal mucosa moist and pink , no lesions erythema or exudate. Lungs: Clear to auscultation bilaterally. Non-labored. Heart: Regular rate and rhythm, no murmurs rubs or gallops.  Abdomen: Bowel sounds are normal, nontender, nondistended, no hepatosplenomegaly or masses, no abdominal bruits or hernia , no rebound or guarding.   Extremities: No lower extremity edema. No clubbing or deformities. Neuro: Alert and oriented x 3.  Grossly intact. Skin: Warm and dry, no jaundice.   Psych: Alert and cooperative, normal mood and affect.   Imaging Studies: Ct Abdomen Pelvis W Contrast  Result Date: 04/20/2017 CLINICAL DATA:  Midsternal chest pain radiating to the  back for several weeks accompanied by nausea and diaphoresis. EXAM: CT ABDOMEN AND PELVIS WITH CONTRAST TECHNIQUE: Multidetector CT imaging of the abdomen and pelvis was performed using the standard protocol following bolus administration of intravenous contrast. CONTRAST:  154mL ISOVUE-300 IOPAMIDOL (ISOVUE-300) INJECTION 61% COMPARISON:  04/06/2009 FINDINGS: Lower chest: Normal heart size without pericardial effusion. Scarring or atelectasis at the lung bases. No effusion or pneumothorax. Hepatobiliary: Status post cholecystectomy. Homogeneous appearance of the liver without space-occupying mass or biliary dilatation. Pancreas: No ductal dilatation, mass nor inflammation. Spleen: Normal Adrenals/Urinary Tract: Normal bilateral adrenal glands and kidneys. No hydroureteronephrosis. Physiologic distention of the urinary bladder without focal mural thickening or calculus. Stomach/Bowel: Fluid and food distended stomach with normal small bowel rotation. Mild  to moderate fluid-filled distention of small bowel loops in the left hemiabdomen without mechanical source of obstruction raising the possibility of small bowel enteritis. Ileal loops are relatively decompressed in appearance. Normal terminal ileum. No large bowel obstruction. Scattered colonic diverticulosis along the distal descending proximal sigmoid colon without acute diverticulitis. Normal appearing appendix. Vascular/Lymphatic: No significant vascular findings are present. No enlarged abdominal or pelvic lymph nodes. Reproductive: Status post hysterectomy. No adnexal masses. Other: No abdominal wall hernia or abnormality. No abdominopelvic ascites. Musculoskeletal: No acute or significant osseous findings. IMPRESSION: Mild fluid-filled distention of jejunal loops suspicious for mild small bowel enteritis. No mechanical obstruction is noted. Electronically Signed   By: Ashley Royalty M.D.   On: 04/20/2017 04:01   US Carotid Bilateral  Result Date:  04/20/2017 CLINICAL DATA:  Syncope EXAM: BILATERAL CAROTID DUPLEX ULTRASOUND TECHNIQUE: Pearline Cables scale imaging, color Doppler and duplex ultrasound were performed of bilateral carotid and vertebral arteries in the neck. COMPARISON:  None. FINDINGS: Criteria: Quantification of carotid stenosis is based on velocity parameters that correlate the residual internal carotid diameter with NASCET-based stenosis levels, using the diameter of the distal internal carotid lumen as the denominator for stenosis measurement. The following velocity measurements were obtained: RIGHT ICA:  83 cm/sec CCA:  81 cm/sec SYSTOLIC ICA/CCA RATIO:  1.0 DIASTOLIC ICA/CCA RATIO:  1.8 ECA:  99 cm/sec LEFT ICA:  81 cm/sec CCA:  95 cm/sec SYSTOLIC ICA/CCA RATIO:  0.9 DIASTOLIC ICA/CCA RATIO:  1.4 ECA:  90 cm/sec RIGHT CAROTID ARTERY: Little if any plaque in the bulb. Low resistance internal carotid Doppler pattern. Tachycardia. RIGHT VERTEBRAL ARTERY:  Antegrade. LEFT CAROTID ARTERY: Little if any plaque in the bulb. Low resistance internal carotid Doppler pattern. LEFT VERTEBRAL ARTERY:  Antegrade. There is a hypoechoic nodule in the left lobe measuring 1.1 x 0.6 x 1.0 cm. IMPRESSION: Less than 50% stenosis in the right and left internal carotid arteries. 1.1 cm hypoechoic left thyroid nodule. Complete thyroid ultrasound is recommended. This nodule meets criteria for annual follow-up. Electronically Signed   By: Marybelle Killings M.D.   On: 04/20/2017 12:24   Dg Chest Port 1 View  Result Date: 04/20/2017 CLINICAL DATA:  Mid chest pain. EXAM: PORTABLE CHEST 1 VIEW COMPARISON:  Radiograph 05/30/2015 FINDINGS: Multiple overlying monitoring devices overlie the chest.The cardiomediastinal contours are normal. The lungs are clear. Pulmonary vasculature is normal. No consolidation, pleural effusion, or pneumothorax. No acute osseous abnormalities are seen. IMPRESSION: No acute pulmonary process. Electronically Signed   By: Jeb Levering M.D.   On:  04/20/2017 01:18   US Thyroid  Result Date: 04/21/2017 CLINICAL DATA:  Thyroid nodule by carotid ultrasound EXAM: THYROID ULTRASOUND TECHNIQUE: Ultrasound examination of the thyroid gland and adjacent soft tissues was performed. COMPARISON:  04/20/2017 carotid ultrasound FINDINGS: Parenchymal Echotexture: Normal Isthmus: 4 mm Right lobe: 5.1 x 1.7 x 1.7 cm Left lobe: 5.0 x 1.8 x 1.6 cm _________________________________________________________ Estimated total number of nodules >/= 1 cm: 1 Number of spongiform nodules >/=  2 cm not described below (TR1): 0 Number of mixed cystic and solid nodules >/= 1.5 cm not described below ([place]): 0 _________________________________________________________ Nodule # 3: Location: Left; Inferior Maximum size: 1.1 cm; Other 2 dimensions: 1.0 x 0.6 cm Composition: solid/almost completely solid (2) Echogenicity: hypoechoic (2) Shape: not taller-than-wide (0) Margins: smooth (0) Echogenic foci: none (0) ACR TI-RADS total points: 4. ACR TI-RADS risk category: TR4 (4-6 points). ACR TI-RADS recommendations: *Given size (>/= 1 - 1.4 cm) and appearance, a follow-up ultrasound in  1 year should be considered based on TI-RADS criteria. _________________________________________________________ There are additional subcentimeter hypoechoic nodules in the right thyroid lower pole measuring 6 mm or less in size. These would not meet criteria for any biopsy or follow-up. No adenopathy IMPRESSION: 1.1 cm left inferior TR 4 nodule meets criteria for follow-up in 1 year. This correlates with the carotid ultrasound finding. The above is in keeping with the ACR TI-RADS recommendations - J Am Coll Radiol 2017;14:587-595. Electronically Signed   By: Jerilynn Mages.  Shick M.D.   On: 04/21/2017 11:49    Assessment and Plan:   TYKEISHA PEER is a 53 y.o. y/o female here to follow up to recent admission to hospital with enteritis.  CT scan of the abdomen showsed distended stomach, small bowel and enteritis. Very  likely etiology was  infectiousor NSAID relatedin origin There was a similar picture of a CT scan back in 2011 which occasionally may suggest crohns disease. Her  admission was complicated with a pause on the EKG with syncope which is being evaluated. Presently back to her baseline GI symptoms which she has had for years with epigastric pain , constipation .     Plan  1. Gastric emptying study  2. Stool H pylori antigen  3. MR enterogram once event recorder is taken out.  4. Continue PPI 5. Miralax 1/2 capful a day for constipation- if has diarrhea to stop, check BMP in 3-5 days .  6. At next visit depending on cardiac work up and above results will decide on timing of EGD +/- colonoscopy.  7. Stool to r/o infection   Dr Ariel Bellows  MD,MRCP [facility]) Follow up in 8 weeks

## 2017-05-13 ENCOUNTER — Other Ambulatory Visit: Payer: Self-pay

## 2017-05-13 ENCOUNTER — Encounter: Payer: Self-pay | Admitting: Gastroenterology

## 2017-05-13 DIAGNOSIS — R55 Syncope and collapse: Secondary | ICD-10-CM | POA: Diagnosis not present

## 2017-05-13 DIAGNOSIS — N6321 Unspecified lump in the left breast, upper outer quadrant: Secondary | ICD-10-CM

## 2017-05-14 LAB — H. PYLORI ANTIGEN, STOOL: H. Pylori Stool Ag, Eia: NEGATIVE

## 2017-05-18 DIAGNOSIS — R55 Syncope and collapse: Secondary | ICD-10-CM | POA: Diagnosis not present

## 2017-05-19 ENCOUNTER — Ambulatory Visit
Admission: RE | Admit: 2017-05-19 | Discharge: 2017-05-19 | Disposition: A | Discharge status: Home / Self Care | Payer: Medicare Other | Source: Ambulatory Visit | Attending: Gastroenterology | Admitting: Gastroenterology

## 2017-05-19 DIAGNOSIS — K5289 Other specified noninfective gastroenteritis and colitis: Secondary | ICD-10-CM | POA: Diagnosis not present

## 2017-05-19 DIAGNOSIS — R197 Diarrhea, unspecified: Secondary | ICD-10-CM | POA: Diagnosis not present

## 2017-05-19 DIAGNOSIS — K529 Noninfective gastroenteritis and colitis, unspecified: Secondary | ICD-10-CM | POA: Diagnosis not present

## 2017-05-19 MED ORDER — GADOBENATE DIMEGLUMINE 529 MG/ML IV SOLN
15.0000 mL | Freq: Once | INTRAVENOUS | Status: AC | PRN
  Administered 2017-05-19: 15 mL via INTRAVENOUS

## 2017-05-20 ENCOUNTER — Encounter: Payer: Self-pay | Admitting: Gastroenterology

## 2017-05-25 DIAGNOSIS — F331 Major depressive disorder, recurrent, moderate: Secondary | ICD-10-CM | POA: Diagnosis not present

## 2017-05-27 ENCOUNTER — Other Ambulatory Visit
Admission: RE | Admit: 2017-05-27 | Discharge: 2017-05-27 | Disposition: A | Discharge status: Home / Self Care | Payer: Medicare Other | Source: Ambulatory Visit | Attending: Gastroenterology | Admitting: Gastroenterology

## 2017-05-27 ENCOUNTER — Telehealth: Payer: Self-pay

## 2017-05-27 ENCOUNTER — Ambulatory Visit
Admission: RE | Admit: 2017-05-27 | Discharge: 2017-05-27 | Disposition: A | Discharge status: Home / Self Care | Payer: Medicare Other | Source: Ambulatory Visit | Attending: Gastroenterology | Admitting: Gastroenterology

## 2017-05-27 DIAGNOSIS — R899 Unspecified abnormal finding in specimens from other organs, systems and tissues: Secondary | ICD-10-CM

## 2017-05-27 DIAGNOSIS — R140 Abdominal distension (gaseous): Secondary | ICD-10-CM | POA: Insufficient documentation

## 2017-05-27 DIAGNOSIS — R109 Unspecified abdominal pain: Secondary | ICD-10-CM | POA: Insufficient documentation

## 2017-05-27 LAB — BASIC METABOLIC PANEL
ANION GAP: 5 (ref 5–15)
BUN: 11 mg/dL (ref 6–20)
CHLORIDE: 104 mmol/L (ref 101–111)
CO2: 30 mmol/L (ref 22–32)
CREATININE: 0.84 mg/dL (ref 0.44–1.00)
Calcium: 9 mg/dL (ref 8.9–10.3)
GFR calc non Af Amer: >60 mL/min (ref >60)
Glucose, Bld: 96 mg/dL (ref 65–99)
Potassium: 4.2 mmol/L (ref 3.5–5.1)
SODIUM: 139 mmol/L (ref 135–145)

## 2017-05-27 MED ORDER — TECHNETIUM TC 99M SULFUR COLLOID
2.0000 | Freq: Once | INTRAVENOUS | Status: AC | PRN
  Administered 2017-05-27: 2.92 via INTRAVENOUS

## 2017-05-27 NOTE — Addendum Note (Signed)
Addended by: Santiago Bur on: 05/27/2017 12:27 PM   Modules accepted: Orders

## 2017-05-27 NOTE — Telephone Encounter (Signed)
Reminded patient to have lab work completed.   She is due for the gastric emptying study today and will have the labs done prior/after.

## 2017-05-30 ENCOUNTER — Encounter: Payer: Self-pay | Admitting: Gastroenterology

## 2017-06-03 ENCOUNTER — Ambulatory Visit: Payer: Medicare Other | Admitting: Internal Medicine

## 2017-06-03 ENCOUNTER — Telehealth: Payer: Self-pay | Admitting: Gastroenterology

## 2017-06-03 NOTE — Telephone Encounter (Signed)
Pt is calling to check on test results please call pt even if there are not back yet to let her know.

## 2017-06-04 NOTE — Telephone Encounter (Signed)
Returned patient's call. No answer. Voicemail full.   Letters were mailed with her results also.

## 2017-06-24 ENCOUNTER — Encounter: Payer: Self-pay | Admitting: Internal Medicine

## 2017-06-24 ENCOUNTER — Ambulatory Visit (INDEPENDENT_AMBULATORY_CARE_PROVIDER_SITE_OTHER): Payer: Medicare Other | Admitting: Internal Medicine

## 2017-06-24 VITALS — BP 112/74 | HR 92 | Ht 65.0 in | Wt 173.2 lb

## 2017-06-24 DIAGNOSIS — R002 Palpitations: Secondary | ICD-10-CM | POA: Diagnosis not present

## 2017-06-24 DIAGNOSIS — I493 Ventricular premature depolarization: Secondary | ICD-10-CM

## 2017-06-24 DIAGNOSIS — G901 Familial dysautonomia [Riley-Day]: Secondary | ICD-10-CM

## 2017-06-24 NOTE — Patient Instructions (Signed)
Medication Instructions: - Your physician recommends that you continue on your current medications as directed. Please refer to the Current Medication list given to you today.  Labwork: - none ordered  Procedures/Testing: - none ordered  Follow-Up: - Your physician recommends that you schedule a follow-up appointment in: 3 months with Dr. Klein.   Any Additional Special Instructions Will Be Listed Below (If Applicable).     If you need a refill on your cardiac medications before your next appointment, please call your pharmacy.   

## 2017-06-24 NOTE — Progress Notes (Signed)
Patient Care Team: Denton Lank, MD as PCP - General (Family Medicine)   HPI  Ariel Bryant is a 47 y.o. female Seen in follow-up for symptoms consistent with dysautonomia. She's had significant GI symptoms and she was referred to [facility].  At the last visit we encouraged exercise salt supplementation and consideration of addressing the psychosocial aspects related to her PTSD  She was recently seen in the hospital.  She had presented with GI symptoms diarrhea nausea vomiting and decreased p.o. intake and had syncope.  While being hooked up to the electrocardiogram she had sinus slowing and sinus arrest.  This occurred on the second occasion.  She has not had recurrent syncope but does have some dizziness   Well hydrated, but still somewhat salt deplete Not exercising, has had two intercurrent viral illnesses.  Showering at night  Anxious to feel normal   Date K Mg TSH  3/19 3.1    4/19 4.2 2.3 0.584  /          Records and Results Reviewed. As above   Past Medical History:  Diagnosis Date  . Anxiety   . GERD (gastroesophageal reflux disease)   . PTSD (post-traumatic stress disorder)   . Sinus tachycardia     Past Surgical History:  Procedure Laterality Date  . BREAST BIOPSY Left 12/28/2016   Affirm Biopsy- path pending. RIbbon clip  . CESAREAN SECTION     NSD x 3 followed by C/S for 8 + pounds breech  . CHOLECYSTECTOMY    . LAPAROSCOPIC OOPHERECTOMY    . VAGINAL HYSTERECTOMY     had BTL, then VH, later developed ovarian mass and had ooph at 47 yo    Current Outpatient Medications  Medication Sig Dispense Refill  . acetaminophen (TYLENOL) 325 MG tablet Take 650 mg by mouth as needed for mild pain, moderate pain, fever or headache.     Marland Kitchen acyclovir (ZOVIRAX) 400 MG tablet Take 400 mg by mouth as needed (AS NEEDED FOR COLD SORES).     Marland Kitchen alprazolam (XANAX) 2 MG tablet Take 2 mg by mouth daily.   3  . Ascorbic Acid (VITAMIN C) 1000 MG tablet Take 1,000 mg  by mouth daily.    . Biotin 1 MG CAPS Take 1 capsule by mouth daily.    . Calcium Carb-Cholecalciferol (CALCIUM + D3) 600-200 MG-UNIT TABS Take 1 tablet by mouth 2 (two) times daily.     Marland Kitchen dicyclomine (BENTYL) 10 MG capsule Take 10 mg by mouth as needed.    . fluticasone (FLONASE) 50 MCG/ACT nasal spray Place 1 spray into both nostrils daily. For Eustachian Tube Dysfunction    . ipratropium (ATROVENT HFA) 17 MCG/ACT inhaler Inhale 2 puffs into the lungs every 4 (four) hours as needed for wheezing.    . Magnesium 500 MG TABS Take 1 tablet by mouth daily as needed.     Marland Kitchen omeprazole (PRILOSEC) 40 MG capsule Take 40 mg by mouth daily.    . potassium gluconate 595 (99 K) MG TABS tablet Take 595 mg by mouth daily.     . vitamin B-12 (CYANOCOBALAMIN) 1000 MCG tablet Take 1,000 mcg by mouth as needed.      No current facility-administered medications for this visit.     Allergies  Allergen Reactions  . Codeine Shortness Of Breath  . Sulfa Antibiotics Anaphylaxis and Other (See Comments)  . Azithromycin Other (See Comments)  . Ciprofloxacin Other (See Comments)  . Doxycycline Monohydrate Other (  See Comments)  . Erythromycin Other (See Comments)  . Doxycycline Rash      Review of Systems negative except from HPI and PMH  Physical Exam BP 112/74 (BP Location: Right Arm, Patient Position: Sitting, Cuff Size: Large)   Pulse 92   Ht 5\' 5"  (1.651 m)   Wt 173 lb 4 oz (78.6 kg)   BMI 28.83 kg/m  Well developed and nourished in no acute distress HENT normal Neck supple with JVP-flat Clear Regular rate and rhythm, no murmurs or gallops Abd-soft with active BS No Clubbing cyanosis edema Skin-warm and dry A & Oriented  Grossly normal sensory and motor function   ECG demonstrates sinus @ 92 15/07/34  Assessment and  Plan  Dysautonomia  PVCs  Palpitations  Psychosocial stress/PTSD   Sinus Arrest and syncope   We discussed extensively the issues of dysautonomia, the physiology  of orthstasis and positional stress.  We discussed the role of salt and water repletion, the importance of exercise, often needing to be started in the recumbent position, and the awareness of triggers and the role of ambient heat and dehydration She has exercise bike at the house, not using  Encouraged GI followup and helpto deal with anxiety and PTSD   PVCs infrequent   We spent more than 50% of our >25 min visit in face to face counseling regarding the above     Current medicines are reviewed at length with the patient today .  The patient does not have concerns regarding medicines.

## 2017-06-25 DIAGNOSIS — J029 Acute pharyngitis, unspecified: Secondary | ICD-10-CM | POA: Diagnosis not present

## 2017-07-12 ENCOUNTER — Ambulatory Visit: Payer: Medicare Other | Admitting: Gastroenterology

## 2017-07-13 ENCOUNTER — Ambulatory Visit
Admission: RE | Admit: 2017-07-13 | Discharge: 2017-07-13 | Disposition: A | Discharge status: Home / Self Care | Payer: Medicare Other | Source: Ambulatory Visit | Attending: General Surgery | Admitting: General Surgery

## 2017-07-13 DIAGNOSIS — N6321 Unspecified lump in the left breast, upper outer quadrant: Secondary | ICD-10-CM

## 2017-07-13 DIAGNOSIS — R928 Other abnormal and inconclusive findings on diagnostic imaging of breast: Secondary | ICD-10-CM | POA: Diagnosis not present

## 2017-07-13 DIAGNOSIS — R05 Cough: Secondary | ICD-10-CM | POA: Diagnosis not present

## 2017-07-16 DIAGNOSIS — Z79899 Other long term (current) drug therapy: Secondary | ICD-10-CM | POA: Diagnosis not present

## 2017-07-16 DIAGNOSIS — F410 Panic disorder [episodic paroxysmal anxiety] without agoraphobia: Secondary | ICD-10-CM | POA: Diagnosis not present

## 2017-07-19 DIAGNOSIS — K219 Gastro-esophageal reflux disease without esophagitis: Secondary | ICD-10-CM | POA: Diagnosis not present

## 2017-07-19 DIAGNOSIS — I471 Supraventricular tachycardia: Secondary | ICD-10-CM | POA: Diagnosis not present

## 2017-07-19 DIAGNOSIS — R591 Generalized enlarged lymph nodes: Secondary | ICD-10-CM | POA: Diagnosis not present

## 2017-07-19 DIAGNOSIS — R928 Other abnormal and inconclusive findings on diagnostic imaging of breast: Secondary | ICD-10-CM | POA: Diagnosis not present

## 2017-07-19 DIAGNOSIS — R05 Cough: Secondary | ICD-10-CM | POA: Diagnosis not present

## 2017-07-28 DIAGNOSIS — R928 Other abnormal and inconclusive findings on diagnostic imaging of breast: Secondary | ICD-10-CM | POA: Diagnosis not present

## 2017-07-28 DIAGNOSIS — R601 Generalized edema: Secondary | ICD-10-CM | POA: Diagnosis not present

## 2017-07-28 DIAGNOSIS — R8290 Unspecified abnormal findings in urine: Secondary | ICD-10-CM | POA: Diagnosis not present

## 2017-07-28 DIAGNOSIS — Z1389 Encounter for screening for other disorder: Secondary | ICD-10-CM | POA: Diagnosis not present

## 2017-07-29 ENCOUNTER — Other Ambulatory Visit: Payer: Medicare Other

## 2017-08-10 ENCOUNTER — Ambulatory Visit: Payer: Medicare Other | Admitting: General Surgery

## 2017-08-11 DIAGNOSIS — Z1231 Encounter for screening mammogram for malignant neoplasm of breast: Secondary | ICD-10-CM | POA: Diagnosis not present

## 2017-08-18 DIAGNOSIS — R928 Other abnormal and inconclusive findings on diagnostic imaging of breast: Secondary | ICD-10-CM | POA: Diagnosis not present

## 2017-08-18 DIAGNOSIS — N6489 Other specified disorders of breast: Secondary | ICD-10-CM | POA: Diagnosis not present

## 2017-08-20 DIAGNOSIS — N6320 Unspecified lump in the left breast, unspecified quadrant: Secondary | ICD-10-CM | POA: Diagnosis not present

## 2017-08-20 DIAGNOSIS — N6489 Other specified disorders of breast: Secondary | ICD-10-CM | POA: Diagnosis not present

## 2017-08-20 DIAGNOSIS — N63 Unspecified lump in unspecified breast: Secondary | ICD-10-CM | POA: Diagnosis not present

## 2017-09-30 ENCOUNTER — Ambulatory Visit: Payer: Medicare Other | Admitting: Internal Medicine

## 2017-10-05 ENCOUNTER — Encounter: Payer: Self-pay | Admitting: Internal Medicine

## 2017-10-07 ENCOUNTER — Encounter: Payer: Self-pay | Admitting: *Deleted

## 2017-10-11 DIAGNOSIS — Z79899 Other long term (current) drug therapy: Secondary | ICD-10-CM | POA: Diagnosis not present

## 2017-10-11 DIAGNOSIS — F410 Panic disorder [episodic paroxysmal anxiety] without agoraphobia: Secondary | ICD-10-CM | POA: Diagnosis not present

## 2017-10-18 DIAGNOSIS — E663 Overweight: Secondary | ICD-10-CM | POA: Diagnosis not present

## 2017-10-18 DIAGNOSIS — R7309 Other abnormal glucose: Secondary | ICD-10-CM | POA: Diagnosis not present

## 2017-10-18 DIAGNOSIS — Z1389 Encounter for screening for other disorder: Secondary | ICD-10-CM | POA: Diagnosis not present

## 2017-10-20 DIAGNOSIS — L239 Allergic contact dermatitis, unspecified cause: Secondary | ICD-10-CM | POA: Diagnosis not present

## 2017-10-26 DIAGNOSIS — Z23 Encounter for immunization: Secondary | ICD-10-CM | POA: Diagnosis not present

## 2017-11-05 DIAGNOSIS — Z0000 Encounter for general adult medical examination without abnormal findings: Secondary | ICD-10-CM | POA: Diagnosis not present

## 2017-11-05 DIAGNOSIS — E041 Nontoxic single thyroid nodule: Secondary | ICD-10-CM | POA: Diagnosis not present

## 2017-11-05 DIAGNOSIS — R7309 Other abnormal glucose: Secondary | ICD-10-CM | POA: Diagnosis not present

## 2017-11-05 DIAGNOSIS — Z1389 Encounter for screening for other disorder: Secondary | ICD-10-CM | POA: Diagnosis not present

## 2017-11-05 DIAGNOSIS — E781 Pure hyperglyceridemia: Secondary | ICD-10-CM | POA: Diagnosis not present

## 2017-11-05 DIAGNOSIS — E876 Hypokalemia: Secondary | ICD-10-CM | POA: Diagnosis not present

## 2017-12-03 DIAGNOSIS — J069 Acute upper respiratory infection, unspecified: Secondary | ICD-10-CM | POA: Diagnosis not present

## 2018-02-22 ENCOUNTER — Encounter: Payer: Self-pay | Admitting: Internal Medicine

## 2018-03-01 ENCOUNTER — Encounter: Payer: Self-pay | Admitting: Internal Medicine

## 2018-03-02 ENCOUNTER — Encounter: Payer: Self-pay | Admitting: Internal Medicine

## 2018-03-04 ENCOUNTER — Telehealth: Payer: Self-pay | Admitting: Internal Medicine

## 2018-03-04 NOTE — Telephone Encounter (Signed)
On 1/29, the patient in the afternoon experienced stomach pain and had diarrhea then later that evening she felt her heart go from 130s down to 50s and back up. This occurred for 30 minutes to an hour. Then she started to feel that she was going to pass out. She stated it was similar to her hospitalization in March 2019. In addition, she also felt sternal chest pain that wrapped around her neck and that she could reproduce by palpating the area. She stated that the only change in her health was having a UTI that was being treated with Cephalexin and she only had one more day of the medication.   On 1/30, the patient was fine and had no symptoms all day. On 1/31, the patient started to feel similar symptoms of her heart rates going up and down around 4 am this morning. Ever since she has not felt the symptoms again. She stated that she has a headache currently and that occurred throughout. She stated these symptoms are not similar to the symptoms she feels when she has her palpitations. The patient called the on-call on 1/29 and they stated the patient should continue to monitor.   Spoke with Dr. Caryl Comes, the patient should continue to monitor and if her chest pain becomes prevalent again then she would need to go to the hospital and if her heart racing and slowing continues to occur then she would need a monitor to determine the problem. The patient expressed understanding and had no further questions. Advised to call the office if she has recurrence of symptoms.

## 2018-03-04 NOTE — Telephone Encounter (Signed)
New Message   Pt c/o of Chest Pain: STAT if CP now or developed within 24 hours  1. Are you having CP right now? No, hasn't felt anything since 4:30am   2. Are you experiencing any other symptoms (ex. SOB, nausea, vomiting, sweating)? No  3. How long have you been experiencing CP? For 2 days now  4. Is your CP continuous or coming and going? Coming and going   5. Have you taken Nitroglycerin? No  Patient will like to come in today if possible.  ?

## 2018-03-23 ENCOUNTER — Other Ambulatory Visit: Payer: Self-pay

## 2018-03-23 DIAGNOSIS — N6321 Unspecified lump in the left breast, upper outer quadrant: Secondary | ICD-10-CM

## 2018-04-02 ENCOUNTER — Encounter: Payer: Self-pay | Admitting: Internal Medicine

## 2018-04-03 ENCOUNTER — Encounter: Payer: Self-pay | Admitting: Internal Medicine

## 2018-04-09 ENCOUNTER — Encounter: Payer: Self-pay | Admitting: Internal Medicine

## 2018-04-11 ENCOUNTER — Encounter: Payer: Self-pay | Admitting: Internal Medicine

## 2018-04-11 ENCOUNTER — Telehealth: Payer: Self-pay | Admitting: Internal Medicine

## 2018-04-11 NOTE — Telephone Encounter (Signed)
Patient reports increase frequency, intensity and duration of heart palpitations. Had episode about 2 weeks ago, where she felt like her heart was beating out of her chest and she was about to pass out. Lasted about 2 hours. Sister who is Designer, jewellery and found her HR was bouncing all over the place.  HR sometimes goes up to the 140's-150's.  She dropped off Kardia strips. Advised I will give to Nira Conn, RN to show to Dr Caryl Comes to review. She is scheduled to see Dr Caryl Comes in May at this time. Pt verbalized understanding to call 911 or go to the emergency room, if he develops any new or worsening symptoms.  Routing to [employer].

## 2018-04-11 NOTE — Telephone Encounter (Signed)
Patient c/o Palpitations:  High priority if patient c/o lightheadedness, shortness of breath, or chest pain  1) How long have you had palpitations/irregular HR/ Afib? Are you having the symptoms now?  No   2) Are you currently experiencing lightheadedness, SOB or CP? No   3) Do you have a history of afib (atrial fibrillation) or irregular heart rhythm?  Yes but afib not documented per patient   4) Have you checked your BP or HR? (document readings if available):  See Kardia strips   5) Are you experiencing any other symptoms? Pre syncopal - weakness patient states pulse will be 140-50's very sporadic

## 2018-04-12 NOTE — Telephone Encounter (Signed)
Alive Cor Jodelle Red) strips recevied.   Will review with Dr. Caryl Comes.

## 2018-04-12 NOTE — Telephone Encounter (Signed)
Alive cor strips reviewed with Dr. Caryl Comes. Per Dr. Caryl Comes, the patient is NSR with some sinus tachycardia noted as well as PAC's. No additional recommendations made at this time, but could see the patient sooner if she would like.  I have called and spoken with the patient. She is aware of Dr. Olin Pia recommendations. She is ok with leaving her follow up appointment in May as scheduled with him.  I have advised her if her symptoms become more of a nuisance and she would like to be seen sooner to please call back and let me know.   The patient voices understanding.

## 2018-04-14 ENCOUNTER — Encounter: Payer: Self-pay | Admitting: Internal Medicine

## 2018-04-14 ENCOUNTER — Encounter: Payer: Self-pay | Admitting: *Deleted

## 2018-04-14 ENCOUNTER — Ambulatory Visit (INDEPENDENT_AMBULATORY_CARE_PROVIDER_SITE_OTHER): Payer: 59 | Admitting: Internal Medicine

## 2018-04-14 ENCOUNTER — Other Ambulatory Visit: Payer: Self-pay

## 2018-04-14 VITALS — BP 121/82 | HR 71 | Ht 65.0 in | Wt 181.2 lb

## 2018-04-14 DIAGNOSIS — G901 Familial dysautonomia [Riley-Day]: Secondary | ICD-10-CM | POA: Diagnosis not present

## 2018-04-14 DIAGNOSIS — R002 Palpitations: Secondary | ICD-10-CM

## 2018-04-14 DIAGNOSIS — I493 Ventricular premature depolarization: Secondary | ICD-10-CM

## 2018-04-14 MED ORDER — FLECAINIDE ACETATE 50 MG PO TABS: 50.0000 mg | ORAL_TABLET | Freq: Two times a day (BID) | ORAL | 6 refills | Status: DC

## 2018-04-14 NOTE — Patient Instructions (Addendum)
Medication Instructions:  - Your physician has recommended you make the following change in your medication:   1) START flecainide 50 mg- take 1 tablet by mouth twice daily  If you need a refill on your cardiac medications before your next appointment, please call your pharmacy.   Lab work: - none ordered  If you have labs (blood work) drawn today and your tests are completely normal, you will receive your results only by: Marland Kitchen MyChart Message (if you have MyChart) OR . A paper copy in the mail If you have any lab test that is abnormal or we need to change your treatment, we will call you to review the results.  Testing/Procedures: -Your physician has requested that you have an exercise tolerance test in 1 week (on a day Dr. Caryl Comes is in the office). .  - you may eat a light breakfast/ lunch prior to your procedure - no caffeine for 24 hours prior to your test (coffee, tea, soft drinks, or chocolate)  - no smoking/ vaping for 4 hours prior to your test - you may take your regular medications the day of your test  - bring any inhalers with you to your test - wear comfortable clothing & tennis/ non-skid shoes to walk on the treadmill  Follow-Up: At [facility], you and your health needs are our priority.  As part of our continuing mission to provide you with exceptional heart care, we have created designated Provider Care Teams.  These Care Teams include your primary Cardiologist (physician) and Advanced Practice Providers (APPs -  Physician Assistants and Nurse Practitioners) who all work together to provide you with the care you need, when you need it. . in May with Dr. Caryl Comes as scheduled  Any Other Special Instructions Will Be Listed Below (If Applicable). - please wear thigh sleeves during your waking hours

## 2018-04-14 NOTE — Progress Notes (Signed)
Patient Care Team: Denton Lank, MD as PCP - General (Family Medicine)   HPI  Ariel Bryant is a 48 y.o. female Seen in follow-up for symptoms consistent with dysautonomia. She's had significant GI symptoms and she was referred to [facility].  At the last visit we encouraged exercise salt supplementation and consideration of addressing the psychosocial aspects related to her PTSD   GI symptoms diarrhea nausea vomiting and decreased p.o. intake and had syncope.  While being hooked up to the electrocardiogram she had sinus slowing and sinus arrest.  This occurred on a second occasion.  Saw GI @ [facility] who felt GI symptoms were secondary to dysautonomia   She comes in today continuing to complain of spells of lightheadedness and weakness.  Some are associated with palpitations.  On one occasion her sister-in-law, nurse practitioner, said her heart rate was up and down.  She obtained an AliveCor which demonstrated on symptomatic tracings PACs and some degree of variability suggesting pausing temporally associated with her spells.  These are associated with tingling nausea and diaphoresis.  Relatively abrupt in onset.  Lasting minutes.  Residual fatigue.    Date K Mg TSH  3/19 3.1    4/19 4.2 2.3 0.584  /      Also complaining of night cramps     Records and Results Reviewed. As above   Past Medical History:  Diagnosis Date  . Anxiety   . GERD (gastroesophageal reflux disease)   . PTSD (post-traumatic stress disorder)   . Sinus tachycardia     Past Surgical History:  Procedure Laterality Date  . BREAST BIOPSY Left 12/28/2016   Affirm Biopsy- Fibrocystic changes. RIbbon clip  . CESAREAN SECTION     NSD x 3 followed by C/S for 8 + pounds breech  . CHOLECYSTECTOMY    . LAPAROSCOPIC OOPHERECTOMY    . VAGINAL HYSTERECTOMY     had BTL, then VH, later developed ovarian mass and had ooph at 48 yo    Current Outpatient Medications  Medication Sig Dispense Refill  .  acetaminophen (TYLENOL) 325 MG tablet Take 650 mg by mouth as needed for mild pain, moderate pain, fever or headache.     Marland Kitchen acyclovir (ZOVIRAX) 400 MG tablet Take 400 mg by mouth as needed (AS NEEDED FOR COLD SORES).     . Ascorbic Acid (VITAMIN C) 1000 MG tablet Take 1,000 mg by mouth daily.    . Biotin 1 MG CAPS Take 1 capsule by mouth daily.    . Calcium Carb-Cholecalciferol (CALCIUM + D3) 600-200 MG-UNIT TABS Take 1 tablet by mouth daily at 2 am.     . clonazePAM (KLONOPIN) 2 MG tablet 2 (two) times daily as needed.    . dicyclomine (BENTYL) 10 MG capsule Take 10 mg by mouth as needed.    . fluticasone (FLONASE) 50 MCG/ACT nasal spray Place 1 spray into both nostrils as needed. For Eustachian Tube Dysfunction    . Garlic (GARLIQUE) 893 MG TBEC Take by mouth daily.    Marland Kitchen ipratropium (ATROVENT HFA) 17 MCG/ACT inhaler Inhale 2 puffs into the lungs every 4 (four) hours as needed for wheezing.    . Magnesium 500 MG TABS Take 1 tablet by mouth daily.     Marland Kitchen omeprazole (PRILOSEC) 40 MG capsule Take 40 mg by mouth daily.    . potassium gluconate 595 (99 K) MG TABS tablet Take 595 mg by mouth daily.     . VENTOLIN HFA 108 (90  Base) MCG/ACT inhaler USE 2 PUFFS EVERY 4-6 HOURS AS NEEDED FOR WHEEZING. MAY SUBSTITUTE INSURANCE FORMULARY.  1  . vitamin B-12 (CYANOCOBALAMIN) 1000 MCG tablet Take 1,000 mcg by mouth as needed.     . flecainide (TAMBOCOR) 50 MG tablet Take 1 tablet (50 mg total) by mouth 2 (two) times daily. 60 tablet 6   No current facility-administered medications for this visit.     Allergies  Allergen Reactions  . Codeine Shortness Of Breath  . Sulfa Antibiotics Anaphylaxis and Other (See Comments)  . Azithromycin Other (See Comments)  . Ciprofloxacin Other (See Comments)  . Doxycycline Monohydrate Other (See Comments)  . Erythromycin Other (See Comments)  . Doxycycline Rash      Review of Systems negative except from HPI and PMH  Physical Exam BP 121/82 (BP Location: Left  Arm, Patient Position: Sitting, Cuff Size: Normal)   Pulse 71   Ht 5\' 5"  (1.651 m)   Wt 181 lb 4 oz (82.2 kg)   BMI 30.16 kg/m  Well developed and nourished in no acute distress HENT normal Neck supple with JVP-  flat  as well presentthis is declined because of [facility] had one thing a second 1 minute to 77 so I was down my nose to talk about that I forgot so.  Cramps at night 1 of the things that may help is  Clear Regular rate and rhythm, no murmurs or gallops Abd-soft with active BS No Clubbing cyanosis edema Skin-warm and dry A & Oriented  Grossly normal sensory and motor function  ECG sinus @ 71 15/07/39  Assessment and  Plan  Dysautonomia  PVCs/PACs  Palpitations  Monitor>> PACs but no PVCs  Psychosocial stress/PTSD   Sinus Arrest and syncope   Discussed extensively in the issues of dysautonomia.  Her AliveCor tracings suggest that PACs may be part of the triggering of her reflex.  She is using compressive spanks without relief.  I suggested that we try thigh sleeves adjunctively on the effector limb but have suggested also that we try low-dose flecainide to see if we can decrease the triggers.  We have discussed proarrhythmia she will come in for treadmill testing  We spent more than 50% of our >25 min visit in face to face counseling regarding the above      Current medicines are reviewed at length with the patient today .  The patient does not have concerns regarding medicines.

## 2018-04-15 ENCOUNTER — Telehealth: Payer: Self-pay | Admitting: Internal Medicine

## 2018-04-15 NOTE — Telephone Encounter (Signed)
Pt c/o medication issue:  1. Name of Medication: Flecainide  2. How are you currently taking this medication (dosage and times per day)?  50 mg twice a day 3. Are you having a reaction (difficulty breathing--STAT)? no  4. What is your medication issue? Woke up with a bad headache and extremely tired.  Please call to discuss.

## 2018-04-15 NOTE — Telephone Encounter (Signed)
I spoke with the patient. She states she took flecainide 50 mg last night at 9:15 pm & 9:15 am this morning.   She woke up with a headache and feeling very tired.  I advised the patient that I looked up the side effects of flecainide and headache is listed. However, I am unsure if this a side effect from her medication or environmental with the weather changes. I suspect it is medication related.  However, she is aware I will review with Dr. Caryl Comes and we will try to call her back prior to the weekend.   She voices understanding.

## 2018-04-15 NOTE — Telephone Encounter (Signed)
Called pt    She will continue flecainide for now and we will readdress on Monday

## 2018-04-16 NOTE — Telephone Encounter (Signed)
Called and spoke with patient regarding HA and nausea  Will continue the flecainide for now and regroup next week

## 2018-04-18 ENCOUNTER — Telehealth: Payer: Self-pay | Admitting: *Deleted

## 2018-04-18 NOTE — Telephone Encounter (Signed)
Patient called and informed of instructions for the GXT tomorrow. She verbalized her understanding.    - you may eat a light breakfast/ lunch prior to your procedure - no caffeine for 24 hours prior to your test (coffee, tea, soft drinks, or chocolate)  - no smoking/ vaping for 4 hours prior to your test - you may take your regular medications the day of your test  - bring any inhalers with you to your test - wear comfortable clothing & tennis/ non-skid shoes to walk on the treadmill

## 2018-04-19 ENCOUNTER — Telehealth: Payer: Self-pay | Admitting: Internal Medicine

## 2018-04-19 NOTE — Telephone Encounter (Signed)
See 04/15/18 phone note.  Will close this encounter.

## 2018-04-19 NOTE — Telephone Encounter (Signed)
I spoke with the patient. I have advised her I was sorry we had to r/s her GXT to Thursday morning. She is aware that Dr. Olin Pia schedule was cancelled for today due to our device patients requiring company reps having to come in to check them and the reps are not allowed to come in.  I have advised her that Dr. Caryl Comes will be here for her GXT on Thursday morning. He is aware this has been r/s until Thursday. She feels like she is "ok" on the flecainide although she had an episode of chest pain on Saturday night where if "hurt for my heart to beat," and her heart was racing.  She spoke with the on-call MD at that time and was told they felt like the drug was trying to treat the rhythm and this was why she was feeling the way she was.   She did have an episode of dizziness last night laying flat and felt like the room was spinning.   I have advised her I am unsure if she is having symptoms from the drug or from the rhythm itself. I have encouraged her to continue her flecainide and come in Thursday for her GXT.  She is aware Dr. Caryl Comes will readdress everything with her at that time.   The patient voices understanding and is agreeable.

## 2018-04-19 NOTE — Telephone Encounter (Signed)
Please call and discuss with pt if she needs to stop Flecainide.

## 2018-04-20 ENCOUNTER — Telehealth: Payer: Self-pay | Admitting: Internal Medicine

## 2018-04-20 NOTE — Telephone Encounter (Signed)
   Per Dr. Caryl Comes we will need to proceed with Flecainide GXT testing tomorrow. Patient contacted for COVID-19 screening questions. _____________   [id number] Pre-Screening Questions:  . Have you recently travelled abroad or to [place], [place] state, or [place]? No (3) . Do you currently have a fever? No (3) . Have you been in contact with someone that is currently pending confirmation of Covid19 testing or has been confirmed to have the [place] virus?  No (3) . Are you currently experiencing fatigue or cough? No (2) . Are you currently experiencing shortness of breath at rest or with minimal activity? No (1) . Have you been in contact with someone that was recently sick with fever/cough/fatigue? Yes- grandchildren- cough but no fever (1)   **A score of 3 or more should result in cancellation of the pts cardiology appt.  **A score of  2 should be provided a mask prior to admission into the lobby  *Travel to a high risk area or contact with a confirmed case should stay at home, away from confirmed  patient, monitor symptoms, and reach out to PCP for e-visit, additional testing.  *ALL PTS W/ FEVER SHOULD BE REFERRED TO PCP FOR E-VISIT*

## 2018-04-21 ENCOUNTER — Other Ambulatory Visit: Payer: Self-pay

## 2018-04-21 ENCOUNTER — Ambulatory Visit (INDEPENDENT_AMBULATORY_CARE_PROVIDER_SITE_OTHER): Payer: 59

## 2018-04-21 DIAGNOSIS — I493 Ventricular premature depolarization: Secondary | ICD-10-CM | POA: Diagnosis not present

## 2018-05-06 ENCOUNTER — Telehealth: Payer: Self-pay | Admitting: Internal Medicine

## 2018-05-06 MED ORDER — FLECAINIDE ACETATE 50 MG PO TABS: 25.0000 mg | ORAL_TABLET | Freq: Two times a day (BID) | ORAL | Status: DC

## 2018-05-06 MED ORDER — MAGNESIUM OXIDE 400 MG PO TABS: 400.0000 mg | ORAL_TABLET | Freq: Every day | ORAL | 2 refills | Status: DC

## 2018-05-06 NOTE — Telephone Encounter (Signed)
Her symptoms are common adverse effects of this medication. She can try to decrease her dose to 25 mg bid and see if that helps. I will defer long term management or alternative therapy to EP.

## 2018-05-06 NOTE — Telephone Encounter (Signed)
Contacted the pt. Pt sts that she has had a headache daily since starting Flecainide on 04/14/18. She is also experiencing fatigue and "fuzzy" vision. She would like to know if the medication can titrated down, or switched to a prn medication.  She has had to take something daily for headache, and she feels that she is having symptoms of depression since starting. Pt sts that she cannot continue this way.   Av the pt that I will send Dr. Caryl Comes a high priority message ane call back with his recommendation. Pt verbalized understanding

## 2018-05-06 NOTE — Telephone Encounter (Signed)
Try adding Mg Oxide 400 mg daily  If still cant tolerate the flecainide, we could try propafenone 150 bid ( not sustained release)

## 2018-05-06 NOTE — Telephone Encounter (Signed)
Pt aware or Dr.Klein's recommendation and is agreeable. Rx sent to the pt pharmacy for Mag Ox 400mg  daily.

## 2018-05-06 NOTE — Telephone Encounter (Signed)
Please call to discuss Flecainide. Pt states she has had headaches since she has started taking this medication. States these are daily. Pt states she has not taken her morning dose yet. Pt would like a call asap.

## 2018-05-06 NOTE — Telephone Encounter (Signed)
Adv the pt that Dr. Caryl Comes will be out of the office for the next week or so. Pt aware of Ryan's recommendation and is agreeable with the plan. Adv pt to take flecainide  1/2 tab (25mg ) bid. Pt verbalized understanding and voiced appreciation for the call.

## 2018-05-12 NOTE — Telephone Encounter (Signed)
Patient calling  Would like to know if it will be possible to call in a lower dosage than 25 MG of flecainide - patient is using a pill cutter and is still having trouble cutting them, most crumble and are wasted Patient also has questions in regards to the magnesium medication and would like to speak with Nira Conn if possible  Please call to discuss

## 2018-05-12 NOTE — Telephone Encounter (Signed)
I spoke with the patient. I advised her that flecainide does not come as a 25 mg tablet.  She states that she is able to cut it in 1/2 but 1 side will be fine and the other will crumble w/ a pill splitter.  I advised Dr. Caryl Comes doesn't typically tell people to cut flecainide down to 25 mg BID.  However, she states that her headaches are better- palpitations are overall better.  She will typically notice that her worst palpitations are at night- she will wake up feeling like her heart is racing, feeling like she has to throw up or urinate, and it's hard for her to get up. This does not happen every night. Symptoms were worse on the 50 mg tablet.  I advised that Dr. Caryl Comes had mentioned the option to switch her to propafenone. She is trying to avoid taking a different drug if possible. She is concerned that another drug won't treat her vasovagal syncope/dysautonomia. I advised that the medications are to settle down her palpitations, but her dysautonomia in itself is better managed with increasing hydration and sodium intake as well as avoiding heat. She did admit to some shower intolerance. Confirms showers are taken in the afternoon now.  She confirms she does have the POTS booklet.  I advised her I am unsure at this point what is the best option for her- I hate to rock the boat too much and switch her over to propafenone if overall she is doing well on the lower dose flecainide, but cutting the 50 mg tablets in half is not ideal either.  She is aware that I will review with Dr. Caryl Comes to get his recommendations and call her back.  She is aware I will try to get back with her today, but it may be Monday before I am able to.  She voices understanding and is agreeable.

## 2018-05-13 NOTE — Telephone Encounter (Signed)
It may be the way between the rock and hard place, ie lets keep trying it and see how she does thx

## 2018-05-16 NOTE — Telephone Encounter (Signed)
I called and spoke with the patient.  I advised her of Dr. Olin Pia recommendations to continue on flecainide 25 mg BID at the present time and let's see how she does.   The patient voices understanding and is agreeable.  She states she is still having headaches, these were worse over the weekend. She is taking both tylenol & Advil for symptoms. I have advised the patient that I am unsure if she is having headaches from the flecainide and the change in the weather is making this more pronounced.  She is agreeable with giving the flecainide another week.  She inquired if Dr. Caryl Comes felt like stopping the flecainide could be an option. I advised again to give it another week and if she feels her palpations are still stable, then we could try a trial off flecainide and see if her headache resolves completely and how her palpitations respond.  The patient voices understanding and is agreeable.   She also inquired about taking medications that had been prescribed as part of her weight loss- Lexapro & Concave. I looked these medications up on [facility] and both showed possible interactions with flecainide- I asked that she follow back up with whomever prescribed those for her about possible interactions with flecainide. She is agreeable.

## 2018-06-09 ENCOUNTER — Telehealth (INDEPENDENT_AMBULATORY_CARE_PROVIDER_SITE_OTHER): Payer: 59 | Admitting: Internal Medicine

## 2018-06-09 ENCOUNTER — Other Ambulatory Visit: Payer: Self-pay

## 2018-06-09 ENCOUNTER — Encounter

## 2018-06-09 ENCOUNTER — Ambulatory Visit: Payer: Medicare Other | Admitting: Internal Medicine

## 2018-06-09 ENCOUNTER — Encounter: Payer: Self-pay | Admitting: Internal Medicine

## 2018-06-09 VITALS — Ht 64.5 in | Wt 179.0 lb

## 2018-06-09 DIAGNOSIS — G901 Familial dysautonomia [Riley-Day]: Secondary | ICD-10-CM

## 2018-06-09 DIAGNOSIS — J45901 Unspecified asthma with (acute) exacerbation: Secondary | ICD-10-CM

## 2018-06-09 DIAGNOSIS — I491 Atrial premature depolarization: Secondary | ICD-10-CM

## 2018-06-09 NOTE — Progress Notes (Signed)
Electrophysiology TeleHealth Note   Due to national recommendations of social distancing due to COVID 19, an audio/video telehealth visit is felt to be most appropriate for this patient at this time. The patient did not have access to video technology/had technical difficulties with video requiring transitioning to audio format only (telephone).  All issues noted in this document were discussed and addressed.  No physical exam could be performed with this format.     See MyChart message from today for the patient's consent to telehealth for [facility].   Date:  06/09/2018   ID:  Ariel Bryant, DOB 1970/10/01, MRN [id number]  Location: patient's home  Provider location: [address] [place]  Evaluation Performed: Follow-up visit  PCP:  Denton Lank, MD  Cardiologist:   Electrophysiologist:  SK   Chief Complaint:  Palpitations   History of Present Illness:    Ariel Bryant is a 48 y.o. female who presents via audio/video conferencing for a telehealth visit today.  Since last being seen in our clinic for dysautonomia with PACs triggering symptoms with interval initiation of flecainide  the patient reports for fewer palpitations.  She thinks the flecainide is doing very well.  She has noted swelling in her hands as well as in her legs below her thigh sleeves.  She wonders whether this is related to increased sodium intake  However, she is intercurrently been diagnosed with sinusitis associated with dyspnea and reactive airways.  She has an antecedent history of asthma that improved following the discontinuation of cigarettes  She has however continued to have dyspnea with O2 sats in the low 90s.  She is using her Ventolin inhaler at least 3 times a day.  She has not been using her Flovent.    The patient denies symptoms of fevers, chills, cough, or new SOB worrisome for COVID 19.    Past Medical History:  Diagnosis Date  . Anxiety   . GERD  (gastroesophageal reflux disease)   . PTSD (post-traumatic stress disorder)   . Sinus tachycardia     Past Surgical History:  Procedure Laterality Date  . BREAST BIOPSY Left 12/28/2016   Affirm Biopsy- Fibrocystic changes. RIbbon clip  . CESAREAN SECTION     NSD x 3 followed by C/S for 8 + pounds breech  . CHOLECYSTECTOMY    . LAPAROSCOPIC OOPHERECTOMY    . VAGINAL HYSTERECTOMY     had BTL, then VH, later developed ovarian mass and had ooph at 48 yo    Current Outpatient Medications  Medication Sig Dispense Refill  . acetaminophen (TYLENOL) 325 MG tablet Take 650 mg by mouth as needed for mild pain, moderate pain, fever or headache.     Marland Kitchen acyclovir (ZOVIRAX) 400 MG tablet Take 400 mg by mouth as needed (AS NEEDED FOR COLD SORES).     . Ascorbic Acid (VITAMIN C) 1000 MG tablet Take 1,000 mg by mouth daily.    . Biotin 1 MG CAPS Take 1 capsule by mouth daily.    . Calcium Carb-Cholecalciferol (CALCIUM + D3) 600-200 MG-UNIT TABS Take 1 tablet by mouth daily at 2 am.     . clonazePAM (KLONOPIN) 2 MG tablet 2 (two) times daily as needed.    . dicyclomine (BENTYL) 10 MG capsule Take 10 mg by mouth as needed.    . flecainide (TAMBOCOR) 50 MG tablet Take 0.5 tablets (25 mg total) by mouth 2 (two) times daily.    . fluticasone (FLONASE) 50  MCG/ACT nasal spray Place 1 spray into both nostrils as needed. For Eustachian Tube Dysfunction    . Garlic (GARLIQUE) 557 MG TBEC Take by mouth daily.    Marland Kitchen ipratropium (ATROVENT HFA) 17 MCG/ACT inhaler Inhale 2 puffs into the lungs every 4 (four) hours as needed for wheezing.    . magnesium oxide (MAG-OX) 400 MG tablet Take 1 tablet (400 mg total) by mouth daily. 30 tablet 2  . omeprazole (PRILOSEC) 40 MG capsule Take 40 mg by mouth daily.    . potassium gluconate 595 (99 K) MG TABS tablet Take 595 mg by mouth daily.     . VENTOLIN HFA 108 (90 Base) MCG/ACT inhaler USE 2 PUFFS EVERY 4-6 HOURS AS NEEDED FOR WHEEZING. MAY SUBSTITUTE INSURANCE FORMULARY.  1   . vitamin B-12 (CYANOCOBALAMIN) 1000 MCG tablet Take 1,000 mcg by mouth as needed.      No current facility-administered medications for this visit.     Allergies:   Codeine; Sulfa antibiotics; Azithromycin; Ciprofloxacin; Doxycycline monohydrate; Erythromycin; and Doxycycline   Social History:  The patient  reports that she has quit smoking. She has never used smokeless tobacco. She reports that she does not drink alcohol or use drugs.   Family History:  The patient's   family history includes Breast cancer in her paternal aunt and paternal aunt; Breast cancer (age of onset: 51) in her maternal aunt; Hypertension in her father.   ROS:  Please see the history of present illness.   All other systems are personally reviewed and negative.    Exam:    Vital Signs:  Ht 5' 4.5" (1.638 m)   Wt 179 lb (81.2 kg)   SpO2 95%   BMI 30.25 kg/m        Labs/Other Tests and Data Reviewed:    Recent Labs: No results found for requested labs within last 8760 hours.   Wt Readings from Last 3 Encounters:  06/09/18 179 lb (81.2 kg)  04/14/18 181 lb 4 oz (82.2 kg)  06/24/17 173 lb 4 oz (78.6 kg)     Other studies personally reviewed: Additional studies/ records that were reviewed today include: *    ASSESSMENT & PLAN:    Dysautonomia  PVCs/PACs  Palpitations  Monitor>> PACs but no PVCs  Psychosocial stress/PTSD   Sinus Arrest and syncope  Asthma/sinusitis   We will continue her on flecainide.  Decrease the intake of fluid retention; encouraged more activity  Long discussion regarding her asthma and the importance of treating the underlying inflammation as well as the reactive airways.  Suggested she reach out to Dr. Posey Pronto regarding oral steroids, perhaps at a lower doses in the past they have been associated with tachypalpitations.  She has an O2 sat monitor   COVID 19 screen The patient denies symptoms of COVID 19 at this time.  The importance of social distancing was  discussed today.  Follow-up:  90m   Current medicines are reviewed at length with the patient today.   The patient has concerns regarding her medicines.  The following changes were made today:  As above   Labs/ tests ordered today include:   No orders of the defined types were placed in this encounter.   Future tests ( post COVID )     Patient Risk:  after full review of this patients clinical status, I feel that they are at moderate  risk at this time.  Today, I have spent  12  minutes with the patient with telehealth  technology discussing the above.  Signed, Virl Axe, MD  06/09/2018 12:57 PM     [facility] [address] [place] [address] [phone] (office) [phone] (fax)

## 2018-06-09 NOTE — Patient Instructions (Addendum)
Medication Instructions:  - Your physician recommends that you continue on your current medications as directed. Please refer to the Current Medication list given to you today.  If you need a refill on your cardiac medications before your next appointment, please call your pharmacy.   Lab work: - none ordered  If you have labs (blood work) drawn today and your tests are completely normal, you will receive your results only by: Marland Kitchen MyChart Message (if you have MyChart) OR . A paper copy in the mail If you have any lab test that is abnormal or we need to change your treatment, we will call you to review the results.  Testing/Procedures: -none ordered  Follow-Up: At [facility], you and your health needs are our priority.  As part of our continuing mission to provide you with exceptional heart care, we have created designated Provider Care Teams.  These Care Teams include your primary Cardiologist (physician) and Advanced Practice Providers (APPs -  Physician Assistants and Nurse Practitioners) who all work together to provide you with the care you need, when you need it.  You will need a follow up appointment in 6 months (November) with Dr. Caryl Comes.  Please call our office 2 months in advance to schedule this appointment. (call the office in early September to schedule).  Any Other Special Instructions Will Be Listed Below (If Applicable). - N/A

## 2018-06-20 LAB — EXERCISE TOLERANCE TEST
CHL CUP RESTING HR STRESS: 112 {beats}/min
CSEPEW: 8.9 METS
CSEPPHR: 181 {beats}/min
Exercise duration (min): 3 min
Exercise duration (sec): 37 s
MPHR: 172 {beats}/min
Percent HR: 105 %
RPE: 17

## 2018-07-08 ENCOUNTER — Telehealth: Payer: Self-pay | Admitting: Internal Medicine

## 2018-07-08 NOTE — Telephone Encounter (Signed)
To MD to review.  She has been complaining of headaches since starting flecainide.   At her e-visit on 06/09/18, she reported doing well with flecainide.

## 2018-07-08 NOTE — Telephone Encounter (Signed)
Pt c/o medication issue:  1. Name of Medication: Flecainide   2. How are you currently taking this medication (dosage and times per day)? Taking 25 mg twice a day  3. Are you having a reaction (difficulty breathing--STAT)? no  4. What is your medication issue? Severe headaches, and just wants to get off medication, doesn't think this is beneficial to her health.

## 2018-07-08 NOTE — Telephone Encounter (Signed)
I spoke with the patient. I advised her of Dr. Olin Pia recommendations to stop flecainide. She confirms she is only taking flecainide at 25 mg BID.  She states she is due to follow up with her ENT next week for some voice changes as well.  I have advised her to call us back the week of 07/18/18 and update [place] as to the status of her headaches and palpitations.   The patient voices understanding and is agreeable.

## 2018-07-08 NOTE — Telephone Encounter (Signed)
I keep having to learn-- 5-10% pts w flecainide will complain of a HA   I gather she is on only 25 bid-- lets have her stop, H and see how she is doing the week after next.  If her HA resolves, we can also try low dose propafenone   [employer]

## 2018-07-15 ENCOUNTER — Telehealth: Payer: Self-pay

## 2018-07-15 MED ORDER — MAGNESIUM OXIDE 400 MG PO TABS: 400.0000 mg | ORAL_TABLET | Freq: Every day | ORAL | 4 refills | Status: DC

## 2018-07-15 NOTE — Telephone Encounter (Signed)
Requested Prescriptions   Signed Prescriptions Disp Refills  . magnesium oxide (MAG-OX) 400 MG tablet 30 tablet 4    Sig: Take 1 tablet (400 mg total) by mouth daily.    Authorizing Provider: Deboraha Sprang    Ordering User: Raelene Bott, BRANDY L

## 2018-08-22 ENCOUNTER — Telehealth (INDEPENDENT_AMBULATORY_CARE_PROVIDER_SITE_OTHER): Payer: Self-pay

## 2018-08-29 NOTE — Telephone Encounter (Signed)
Patient scheduled.

## 2018-08-31 ENCOUNTER — Telehealth: Payer: Self-pay | Admitting: Internal Medicine

## 2018-08-31 NOTE — Telephone Encounter (Signed)
Would suggest two things  1) office orthostatics 2) propafenone 150 bid   -- but first did her HA resolve off flec or was it related to her ENT issue

## 2018-08-31 NOTE — Telephone Encounter (Signed)
Received incoming call from patient.  This morning, patient felt lightheaded and had the feeling of passing out around 05 am this morning.  She woke up with the feeling that her heart rate was increased and it was 125-129 bpm while laying in the bed.  When she got up out of the bed her HR went up to 137-142. Denies chest pain or shortness of breath. She was unable to go to work and is always very fatigued after this happens.   HR now is 84. She has not had the syncope in a while until today. She does continue to have increased HR to the 120's at times for 1-2 minutes a few times a week with no associated symptoms.  She wants a virtual visit with Dr Caryl Comes to discuss how debilitating this is.  She is unable to go to work when this happens.

## 2018-08-31 NOTE — Telephone Encounter (Signed)
Pt c/o Syncope: STAT if syncope occurred within 30 minutes and pt complains of lightheadedness High Priority if episode of passing out, completely, today or in last 24 hours   1. Did you pass out today? No, Almost passed out around 5 am this morning.  2. When is the last time you passed out? Almost this morning around 5 am  3. Has this occurred multiple times? yes  4. Did you have any symptoms prior to passing out? Fatigue, feels like "I have ran a marathon".   BP Monday was 138/82, HR 137-142 15 minutes ago.

## 2018-08-31 NOTE — Telephone Encounter (Signed)
Dr. Caryl Comes- any thoughts besides salt/ fluid/ avoid heat if possible?  I'm really not sure what else to tell her.

## 2018-09-01 NOTE — Telephone Encounter (Signed)
I spoke with the patient. She confirms she is off of flecainide. She states her headaches have not really improved. She is due to have a CT of the head/ neck/ chest in the near future- she is currently waiting on a date for this.   I have advised her of Dr. Olin Pia recommendations. She will try to get orthostatic BP (HR) readings at home and call those results to me. She is aware to lay for 10 minutes prior to starting these, then take her BP(HR) laying, sit up and take these readings again, then stand up and take these readings again and at 3 minutes standing.  She states she would prefer an e-visit with Dr. Caryl Comes prior to starting a new medication.  I have offered her an e-visit on 8/11 at 8:00 am. She is agreeable with this.  I have advised her to avoid the heat for any extended time frame & increase her salt & fluid intake as well.  The patient voices understanding and is agreeable with the above plan.

## 2018-09-05 ENCOUNTER — Other Ambulatory Visit (INDEPENDENT_AMBULATORY_CARE_PROVIDER_SITE_OTHER): Payer: Self-pay | Admitting: Vascular Surgery

## 2018-09-05 DIAGNOSIS — I83813 Varicose veins of bilateral lower extremities with pain: Secondary | ICD-10-CM

## 2018-09-05 DIAGNOSIS — L819 Disorder of pigmentation, unspecified: Secondary | ICD-10-CM

## 2018-09-08 ENCOUNTER — Encounter (INDEPENDENT_AMBULATORY_CARE_PROVIDER_SITE_OTHER): Payer: Self-pay | Admitting: Vascular Surgery

## 2018-09-08 ENCOUNTER — Other Ambulatory Visit: Payer: Self-pay

## 2018-09-08 ENCOUNTER — Ambulatory Visit (INDEPENDENT_AMBULATORY_CARE_PROVIDER_SITE_OTHER): Payer: 59

## 2018-09-08 ENCOUNTER — Ambulatory Visit (INDEPENDENT_AMBULATORY_CARE_PROVIDER_SITE_OTHER): Payer: 59 | Admitting: Vascular Surgery

## 2018-09-08 VITALS — BP 128/84 | HR 73 | Resp 20 | Ht 64.5 in | Wt 182.0 lb

## 2018-09-08 DIAGNOSIS — L819 Disorder of pigmentation, unspecified: Secondary | ICD-10-CM

## 2018-09-08 DIAGNOSIS — G4762 Sleep related leg cramps: Secondary | ICD-10-CM

## 2018-09-08 DIAGNOSIS — I10 Essential (primary) hypertension: Secondary | ICD-10-CM

## 2018-09-08 DIAGNOSIS — I7300 Raynaud's syndrome without gangrene: Secondary | ICD-10-CM

## 2018-09-08 DIAGNOSIS — K219 Gastro-esophageal reflux disease without esophagitis: Secondary | ICD-10-CM | POA: Diagnosis not present

## 2018-09-08 DIAGNOSIS — I83813 Varicose veins of bilateral lower extremities with pain: Secondary | ICD-10-CM | POA: Diagnosis not present

## 2018-09-11 ENCOUNTER — Encounter (INDEPENDENT_AMBULATORY_CARE_PROVIDER_SITE_OTHER): Payer: Self-pay | Admitting: Vascular Surgery

## 2018-09-11 DIAGNOSIS — I7300 Raynaud's syndrome without gangrene: Secondary | ICD-10-CM | POA: Insufficient documentation

## 2018-09-11 DIAGNOSIS — G4762 Sleep related leg cramps: Secondary | ICD-10-CM | POA: Insufficient documentation

## 2018-09-11 NOTE — Progress Notes (Addendum)
MRN : [id number]  Ariel Bryant is a 48 y.o. (09-06-1970) female who presents with chief complaint of  Chief Complaint  Patient presents with  . New Patient (Initial Visit)    leg cramps  .  History of Present Illness:   The patient is seen for the evaluation of painful toes associated with mild Raynaud's changes. The patient notes the toes turn  blue and become mildly painful. Exposure to cold environments makes the symptoms much worse. The changes have been going on for years and seemed to be much worse lately. There is no history of trauma or repetitive injury. The patient denies peeling of the skin of the fingers in the palms and the skin of the toes on the soles.  The patient has not been taking Norvasc.  There is no history of malignancy or autoimmune disease.  She is also describing nocturnal leg cramps.  These occur several times a month but not every night.  She notes when they happen they are severe waking her up  She has treated them with yellow mustard   The patient denies amaurosis fugax or recent TIA symptoms. There are no recent neurological changes noted. The patient denies claudication symptoms or rest pain symptoms. The patient denies history of DVT, PE or superficial thrombophlebitis. The patient denies recent episodes of angina or shortness of breath.   Venous duplex is normal ABI's Rt=0.95 and Lt=1.13 (all signals are triphasic)  Current Meds  Medication Sig  . acetaminophen (TYLENOL) 325 MG tablet Take 650 mg by mouth as needed for mild pain, moderate pain, fever or headache.   Marland Kitchen acyclovir (ZOVIRAX) 400 MG tablet Take 400 mg by mouth as needed (AS NEEDED FOR COLD SORES).   . Ascorbic Acid (VITAMIN C) 1000 MG tablet Take 1,000 mg by mouth daily.  . bimatoprost (LATISSE) 0.03 % ophthalmic solution Administer to both eyes nightly. Place one drop on applicator and apply evenly along the skin of the upper eyelid at base of eyelashes once daily at bedtime;  repeat procedure for second eye (use a clean applicator).  . Biotin 1 MG CAPS Take 1 capsule by mouth daily.  . Calcium Carb-Cholecalciferol (CALCIUM + D3) 600-200 MG-UNIT TABS Take 1 tablet by mouth daily at 2 am.   . clonazePAM (KLONOPIN) 2 MG tablet 2 (two) times daily as needed.  . dicyclomine (BENTYL) 10 MG capsule Take 10 mg by mouth as needed.  . fluticasone (FLONASE) 50 MCG/ACT nasal spray Place 1 spray into both nostrils as needed. For Eustachian Tube Dysfunction  . Garlic (GARLIQUE) 595 MG TBEC Take by mouth daily.  Marland Kitchen ipratropium (ATROVENT HFA) 17 MCG/ACT inhaler Inhale 2 puffs into the lungs every 4 (four) hours as needed for wheezing.  Marland Kitchen LORAZEPAM PO Take 2 mg by mouth daily.  . montelukast (SINGULAIR) 10 MG tablet TAKE 1 TABLET BY MOUTH DAILY FOR ASTHMA AND ALLERGIES  . niacin (SLO-NIACIN) 500 MG tablet Take by mouth.  . Omega-3 1000 MG CAPS Fish Oil 300 mg-1,000 mg capsule  TAKE ONE CAPSULE TWICE DAILY.  Marland Kitchen omeprazole (PRILOSEC) 40 MG capsule Take 40 mg by mouth daily.  . potassium gluconate 595 (99 K) MG TABS tablet Take 595 mg by mouth daily.   . Turmeric, Curcuma Longa, (TURMERIC ROOT) POWD Take by mouth.  . VENTOLIN HFA 108 (90 Base) MCG/ACT inhaler USE 2 PUFFS EVERY 4-6 HOURS AS NEEDED FOR WHEEZING. MAY SUBSTITUTE INSURANCE FORMULARY.  . vitamin B-12 (CYANOCOBALAMIN) 1000 MCG tablet Take 1,000  mcg by mouth as needed.     Past Medical History:  Diagnosis Date  . Anxiety   . GERD (gastroesophageal reflux disease)   . PTSD (post-traumatic stress disorder)   . Sinus tachycardia     Past Surgical History:  Procedure Laterality Date  . BREAST BIOPSY Left 12/28/2016   Affirm Biopsy- Fibrocystic changes. RIbbon clip  . CESAREAN SECTION     NSD x 3 followed by C/S for 8 + pounds breech  . CHOLECYSTECTOMY    . LAPAROSCOPIC OOPHERECTOMY    . VAGINAL HYSTERECTOMY     had BTL, then VH, later developed ovarian mass and had ooph at 48 yo    Social History Social History    Tobacco Use  . Smoking status: Former Research scientist (life sciences)  . Smokeless tobacco: Never Used  Substance Use Topics  . Alcohol use: No  . Drug use: No    Family History Family History  Problem Relation Age of Onset  . Hypertension Father   . Breast cancer Maternal Aunt 30  . Breast cancer Paternal Aunt   . Breast cancer Paternal Aunt   No family history of bleeding/clotting disorders, porphyria or autoimmune disease   Allergies  Allergen Reactions  . Codeine Shortness Of Breath  . Dilaudid  [Hydromorphone Hcl] Tinitus  . Sulfa Antibiotics Anaphylaxis and Other (See Comments)  . Aspirin Effervescent Nausea And Vomiting  . Azithromycin Other (See Comments)  . Ciprofloxacin Other (See Comments)  . Doxycycline Monohydrate Other (See Comments)  . Erythromycin Other (See Comments)  . Doxycycline Rash     REVIEW OF SYSTEMS (Negative unless checked)  Constitutional: [] Weight loss  [] Fever  [] Chills Cardiac: [] Chest pain   [] Chest pressure   [] Palpitations   [] Shortness of breath when laying flat   [] Shortness of breath with exertion. Vascular:  [] Pain in legs with walking   [x] Pain in legs at rest  [] History of DVT   [] Phlebitis   [] Swelling in legs   [] Varicose veins   [] Non-healing ulcers Pulmonary:   [] Uses home oxygen   [] Productive cough   [] Hemoptysis   [] Wheeze  [] COPD   [] Asthma Neurologic:  [] Dizziness   [] Seizures   [] History of stroke   [] History of TIA  [] Aphasia   [] Vissual changes   [] Weakness or numbness in arm   [] Weakness or numbness in leg Musculoskeletal:   [] Joint swelling   [] Joint pain   [] Low back pain Hematologic:  [] Easy bruising  [] Easy bleeding   [] Hypercoagulable state   [] Anemic Gastrointestinal:  [] Diarrhea   [] Vomiting  [x] Gastroesophageal reflux/heartburn   [] Difficulty swallowing. Genitourinary:  [] Chronic kidney disease   [] Difficult urination  [] Frequent urination   [] Blood in urine Skin:  [] Rashes   [] Ulcers  Psychological:  [] History of anxiety   []  History  of major depression.  Physical Examination  Vitals:   09/08/18 0923  BP: 128/84  Pulse: 73  Resp: 20  Weight: 182 lb (82.6 kg)  Height: 5' 4.5" (1.638 m)   Body mass index is 30.76 kg/m. Gen: WD/WN, NAD Head: [place]/AT, No temporalis wasting.  Ear/Nose/Throat: Hearing grossly intact, nares w/o erythema or drainage, poor dentition Eyes: PER, EOMI, sclera nonicteric.  Neck: Supple, no masses.  No bruit or JVD.  Pulmonary:  Good air movement, clear to auscultation bilaterally, no use of accessory muscles.  Cardiac: RRR, normal S1, S2, no Murmurs. Vascular: mild blue discoloration of the toes Vessel Right Left  Radial Palpable Palpable  PT Trace Palpable Trace Palpable  DP 1+ Palpable 1+ Palpable  Gastrointestinal: soft, non-distended. No guarding/no peritoneal signs.  Musculoskeletal: M/S 5/5 throughout.  No deformity or atrophy.  Neurologic: CN 2-12 intact. Pain and light touch intact in extremities.  Symmetrical.  Speech is fluent. Motor exam as listed above. Psychiatric: Judgment intact, Mood & affect appropriate for pt's clinical situation. Dermatologic: No rashes or ulcers noted.  No changes consistent with cellulitis. Lymph : No Cervical lymphadenopathy, no lichenification or skin changes of chronic lymphedema.  CBC Lab Results  Component Value Date   WBC 4.8 04/21/2017   HGB 12.1 04/21/2017   HCT 36.5 04/21/2017   MCV 86.0 04/21/2017   PLT 206 04/21/2017    BMET    Component Value Date/Time   NA 139 05/27/2017 1236   NA 141 06/13/2015 1048   NA 143 08/24/2011 0238   K 4.2 05/27/2017 1236   K 3.3 (L) 08/24/2011 0238   CL 104 05/27/2017 1236   CL 107 08/24/2011 0238   CO2 30 05/27/2017 1236   CO2 22 08/24/2011 0238   GLUCOSE 96 05/27/2017 1236   GLUCOSE 140 (H) 08/24/2011 0238   BUN 11 05/27/2017 1236   BUN 9 06/13/2015 1048   BUN 14 08/24/2011 0238   CREATININE 0.84 05/27/2017 1236   CREATININE 0.67 08/24/2011 0238   CALCIUM 9.0 05/27/2017 1236    CALCIUM 8.3 (L) 08/24/2011 0238   GFRNONAA >60 05/27/2017 1236   GFRNONAA >60 08/24/2011 0238   GFRAA >60 05/27/2017 1236   GFRAA >60 08/24/2011 0238   CrCl cannot be calculated (Patient's most recent lab result is older than the maximum 21 days allowed.).  COAG Lab Results  Component Value Date   INR 0.9 08/24/2011    Radiology No results found.   Assessment/Plan 1. Nocturnal leg cramps Recommend:  The patient is describing Charley horse type leg cramps. No invasive studies, angiography or surgery at this time.    I have reviewed homeopathic remedies such as Cider vinegar or mustard; placing a bar of soap at the bottom of the bed. Quinine is also an option Magnesium supplementation at bedtime was also reviewed.  The patient should continue walking and begin a more formal exercise program.  The patient should continue antiplatelet therapy and aggressive treatment of the lipid abnormalities  The patient should continue wearing graduated compression socks 10-15 mmHg strength to control any mild edema.  The patient will follow up with me on a PRN basis.   2. Raynaud's disease without gangrene Recommend:  The patient is currently tolerating the Raynaud's changes fairly well. Lengthy discussion regarding keeping the feet and the hands warm; gloves, washing with only warm water (especially avoiding cold water immersion) and using wool socks, specifically Smartwool was recommended.  Possibility of using Norvasc was discussed but it was decided to hold off for now until the benefits of conservative therapy is assessed.  The use of Pletal was also reviewed as well as the fact that it is an off label use which is typically reserved for failures of Norvasc and conservative therapy.  Also it is found to be useful in patients that have ulcerated.  The patient will follow up PRN if the changes worsen or persist.      3. Essential hypertension Continue antihypertensive medications  as already ordered, these medications have been reviewed and there are no changes at this time.   4. Gastroesophageal reflux disease without esophagitis Continue PPI as already ordered, this medication has been reviewed and there are no changes at this time.  Avoidence of  caffeine and alcohol  Moderate elevation of the head of the bed     Hortencia Pilar, MD  09/11/2018 11:34 AM

## 2018-09-12 ENCOUNTER — Ambulatory Visit: Payer: Medicare Other | Admitting: Physical Therapy

## 2018-09-12 NOTE — Progress Notes (Signed)
Electrophysiology TeleHealth Note   Due to national recommendations of social distancing due to COVID 19, an audio/video telehealth visit is felt to be most appropriate for this patient at this time.  See MyChart message from today for the patient's consent to telehealth for [facility].   Date:  09/12/2018   ID:  Ariel Bryant, DOB [birth year], MRN [id number]  Location: patient's home  Provider location: [address], [place]  Evaluation Performed: Follow-up visit  PCP:  Denton Lank, MD  Cardiologist:   Electrophysiologist:  SK   Chief Complaint:  syncope  History of Present Illness:    Ariel Bryant is a 48 y.o. female who presents via audio/video conferencing for a telehealth visit today.  Since last being seen in our clinic for dysautonomia and syncope  the patient reports having lost her job and struggling with anxiety  We reviewed her available hx and data 1-episodes in the hospital were associated with progressive PP prolongation and asystole in the context of GI symptoms 2-episodes of lightheadedness from her event recorder 3/19 were associated with sinus tachycardia in the range of 110-20 3-her recent events were associated with PACs as identified by an alive core monitor.  Treatment with flecainide was associated with fewer palpitations and she thinks fewer lightheaded spells.  It had been discontinued because of headaches which have not totally abated. 4-Diaphoresis, nauseated,  Syncope with eyes rolls back with open, with prodrome sufficient to sit down, but does not allow work--  can occur with lying down, about 50% of the time, also claims syncope when lying down, denies olfactory hallucinations  5-extensive neuropsychiatric history.  Anxiety has been overwhelming.  Her psychiatrist is tried her on antidepressants and right now using benzodiazepines.     Also intercurrently diagnosed with sinusitis and asthma     The patient denies  symptoms of fevers, chills, cough, or new SOB worrisome for COVID 19.    Past Medical History:  Diagnosis Date  . Anxiety   . GERD (gastroesophageal reflux disease)   . PTSD (post-traumatic stress disorder)   . Sinus tachycardia     Past Surgical History:  Procedure Laterality Date  . BREAST BIOPSY Left 12/28/2016   Affirm Biopsy- Fibrocystic changes. RIbbon clip  . CESAREAN SECTION     NSD x 3 followed by C/S for 8 + pounds breech  . CHOLECYSTECTOMY    . LAPAROSCOPIC OOPHERECTOMY    . VAGINAL HYSTERECTOMY     had BTL, then VH, later developed ovarian mass and had ooph at 48 yo    Current Outpatient Medications  Medication Sig Dispense Refill  . acetaminophen (TYLENOL) 325 MG tablet Take 650 mg by mouth as needed for mild pain, moderate pain, fever or headache.     Marland Kitchen acyclovir (ZOVIRAX) 400 MG tablet Take 400 mg by mouth as needed (AS NEEDED FOR COLD SORES).     . Ascorbic Acid (VITAMIN C) 1000 MG tablet Take 1,000 mg by mouth daily.    . bimatoprost (LATISSE) 0.03 % ophthalmic solution Administer to both eyes nightly. Place one drop on applicator and apply evenly along the skin of the upper eyelid at base of eyelashes once daily at bedtime; repeat procedure for second eye (use a clean applicator).    . Biotin 1 MG CAPS Take 1 capsule by mouth daily.    . Calcium Carb-Cholecalciferol (CALCIUM + D3) 600-200 MG-UNIT TABS Take 1 tablet by mouth daily at 2 am.     .  clonazePAM (KLONOPIN) 2 MG tablet 2 (two) times daily as needed.    . dicyclomine (BENTYL) 10 MG capsule Take 10 mg by mouth as needed.    . fluticasone (FLONASE) 50 MCG/ACT nasal spray Place 1 spray into both nostrils as needed. For Eustachian Tube Dysfunction    . Garlic (GARLIQUE) 671 MG TBEC Take by mouth daily.    Marland Kitchen ipratropium (ATROVENT HFA) 17 MCG/ACT inhaler Inhale 2 puffs into the lungs every 4 (four) hours as needed for wheezing.    Marland Kitchen LORAZEPAM PO Take 2 mg by mouth daily.    . magnesium oxide (MAG-OX) 400 MG  tablet Take 1 tablet (400 mg total) by mouth daily. (Patient not taking: Reported on 09/08/2018) 30 tablet 4  . montelukast (SINGULAIR) 10 MG tablet TAKE 1 TABLET BY MOUTH DAILY FOR ASTHMA AND ALLERGIES    . niacin (SLO-NIACIN) 500 MG tablet Take by mouth.    . Omega-3 1000 MG CAPS Fish Oil 300 mg-1,000 mg capsule  TAKE ONE CAPSULE TWICE DAILY.    Marland Kitchen omeprazole (PRILOSEC) 40 MG capsule Take 40 mg by mouth daily.    . potassium gluconate 595 (99 K) MG TABS tablet Take 595 mg by mouth daily.     . Turmeric, Curcuma Longa, (TURMERIC ROOT) POWD Take by mouth.    . VENTOLIN HFA 108 (90 Base) MCG/ACT inhaler USE 2 PUFFS EVERY 4-6 HOURS AS NEEDED FOR WHEEZING. MAY SUBSTITUTE INSURANCE FORMULARY.  1  . vitamin B-12 (CYANOCOBALAMIN) 1000 MCG tablet Take 1,000 mcg by mouth as needed.      No current facility-administered medications for this visit.     Allergies:   Codeine, Dilaudid  [hydromorphone hcl], Sulfa antibiotics, Aspirin effervescent, Azithromycin, Ciprofloxacin, Doxycycline monohydrate, Erythromycin, and Doxycycline   Social History:  The patient  reports that she has quit smoking. She has never used smokeless tobacco. She reports that she does not drink alcohol or use drugs.   Family History:  The patient's   family history includes Breast cancer in her paternal aunt and paternal aunt; Breast cancer (age of onset: 39) in her maternal aunt; Hypertension in her father.   ROS:  Please see the history of present illness.   All other systems are personally reviewed and negative.    Exam:    Vital Signs:  There were no vitals taken for this visit.   Well appearing, alert and conversant, regular work of breathing,  good skin color Eyes- anicteric, neuro- grossly intact, skin- no apparent rash or lesions or cyanosis, mouth- oral mucosa is pink   Labs/Other Tests and Data Reviewed:    Recent Labs: No results found for requested labs within last 8760 hours.   Wt Readings from Last 3  Encounters:  09/08/18 182 lb (82.6 kg)  06/09/18 179 lb (81.2 kg)  04/14/18 181 lb 4 oz (82.2 kg)     Other studies personally reviewed: Additional studies/ records that were reviewed today include: As above    ASSESSMENT & PLAN:    Dysautonomia  PVCs/PACs  PalpitationsMonitor>>PACs but no PVCs  Psychosocial stress/PTSD  Sinus Arrest and syncope  Asthma/sinusitis   Situation is complex and undoubtedly multifactorial.  Her husband's description of her syncope makes pseudo-syncope exceedingly unlikely not withstanding the description of frequent events unassociated with orthostatic stresses.  This latter raises the possibility of other noncardiovascular causes i.e. seizures,, particularly temporal lobe events.  It also gets back to the issue of triggers.GI was noted initally; more recently we have considered arrhythmic with  some improvement with flecainide.  The HA did not retrospectively seem to be secondary to the drug, so will resume it and mag oxide  Encouraged her to exercise at home, and unfortunately, having lost her job, she is now able to lie down with her prodrome.  Also to continue to seek relief from her psychostress issues, knowing that there is a component wherein the dysautonomia may be making the anxiety and such worse ,     COVID 19 screen The patient denies symptoms of COVID 19 at this time.  The importance of social distancing was discussed today.  Follow-up:  3 weeks     Current medicines are reviewed at length with the patient today.   The patient does not have concerns regarding her medicines.  The following changes were made today:  none  Labs/ tests ordered today include:   No orders of the defined types were placed in this encounter.   Future tests ( post COVID )    Patient Risk:  after full review of this patients clinical status, I feel that they are at moderate  risk at this time.  Today, I have spent 27  minutes with the patient  with telehealth technology discussing the above.  Signed, Virl Axe, MD  09/12/2018 9:39 PM     [address] [place] [id number] [phone] (office) [phone] (fax)

## 2018-09-13 ENCOUNTER — Telehealth: Payer: Self-pay | Admitting: Internal Medicine

## 2018-09-13 ENCOUNTER — Telehealth (INDEPENDENT_AMBULATORY_CARE_PROVIDER_SITE_OTHER): Payer: 59 | Admitting: Internal Medicine

## 2018-09-13 ENCOUNTER — Other Ambulatory Visit: Payer: Self-pay

## 2018-09-13 DIAGNOSIS — G901 Familial dysautonomia [Riley-Day]: Secondary | ICD-10-CM | POA: Diagnosis not present

## 2018-09-13 DIAGNOSIS — I493 Ventricular premature depolarization: Secondary | ICD-10-CM

## 2018-09-13 DIAGNOSIS — F419 Anxiety disorder, unspecified: Secondary | ICD-10-CM

## 2018-09-13 DIAGNOSIS — R55 Syncope and collapse: Secondary | ICD-10-CM

## 2018-09-13 DIAGNOSIS — F4310 Post-traumatic stress disorder, unspecified: Secondary | ICD-10-CM

## 2018-09-13 DIAGNOSIS — I491 Atrial premature depolarization: Secondary | ICD-10-CM

## 2018-09-13 DIAGNOSIS — J45909 Unspecified asthma, uncomplicated: Secondary | ICD-10-CM

## 2018-09-13 DIAGNOSIS — J329 Chronic sinusitis, unspecified: Secondary | ICD-10-CM

## 2018-09-13 DIAGNOSIS — R002 Palpitations: Secondary | ICD-10-CM | POA: Diagnosis not present

## 2018-09-13 DIAGNOSIS — Z87891 Personal history of nicotine dependence: Secondary | ICD-10-CM

## 2018-09-13 NOTE — Telephone Encounter (Signed)
Pt calling to give Dr. Caryl Comes update following virtual visit with Dr. Caryl Comes this am.   Please see her included answers and she also wanted Dr. Caryl Comes to be aware of sx with Flecainide. She feels that if she could take flecainide as needed as she feels that she could deal with s/e of meds if she didn't have to take it everyday.   I told patient that I am not sure med can be taken as needed but would reach out to Dr. Caryl Comes to further advise.

## 2018-09-13 NOTE — Telephone Encounter (Signed)
1. Patient asked husband per klein and he says syncope is witness by him 85-90 % of time and that her eyes are open but " no one is there"  2.   patient wants to ask if she can change flecainide to only take on days with palpitations because of side effects.  She states it gives her a headache and causes slight pain in chest

## 2018-09-19 ENCOUNTER — Encounter: Payer: Medicare Other | Admitting: Physical Therapy

## 2018-09-19 NOTE — Telephone Encounter (Signed)
Call to patient to review POC from Dr. Caryl Comes. Before starting the medication back full time she wants him to be aware of the chest tightness, discomfort.   Pt reports "it feels like my heart is not beating right, it feels like it is pounding even though I know it is not. If Dr. Caryl Comes says he believes this is a normal side effect of the medication and I am not in any danger I will take it but I am afraid to take it everyday until I hear from him".   Advised pt that I will reach out to Dr. Caryl Comes for further information.

## 2018-09-19 NOTE — Telephone Encounter (Signed)
Her symptoms come fast and so would have her take it regularly

## 2018-09-20 ENCOUNTER — Encounter: Payer: Medicare Other | Admitting: Physical Therapy

## 2018-09-24 NOTE — Telephone Encounter (Signed)
Iva  Lets do this, arrange for CTA or myoview ( [address]) --which ever is faster and easier to get  if it is normal, then we can be quite confident her chest pain is not cardiac and the flecainide would be safe Thanks SK

## 2018-09-27 ENCOUNTER — Encounter: Payer: Medicare Other | Admitting: Physical Therapy

## 2018-09-28 NOTE — Telephone Encounter (Signed)
I spoke with Dr. Caryl Comes to see if he had spoken with the patient as I thought he was going to call her last week.  Per Dr. Caryl Comes, he did not call her, but will try to call her today to discuss recommendations.

## 2018-10-03 ENCOUNTER — Encounter: Payer: Medicare Other | Admitting: Physical Therapy

## 2018-10-04 ENCOUNTER — Encounter: Payer: Self-pay | Admitting: Internal Medicine

## 2018-10-04 ENCOUNTER — Telehealth: Payer: Medicare Other | Admitting: Internal Medicine

## 2018-10-04 NOTE — Progress Notes (Unsigned)
Called and awakened pt from sleep-- they had awakened to see the sunrise  Discussed the flecainide,  Initially with HA which abated with continuing use and did not resolve following its discontinuation After a month, still having symptoms of "slow HR" and "fast HR"-- not sure but would expect the former is related to ectopy and the latter to sinus tach--although she is known to have atrial ectopy and couplets, no documented atrial tach has yet been seen She also had chest pain noted on phone message 04/19/18>> none since  Hence will declare flecainide a failure  We discussed using propafenone ( and I considered dronaderone--but she has no drug coverage)  Reviewed side effects  She will use her aliveCor as infor

## 2018-10-05 MED ORDER — PROPAFENONE HCL 150 MG PO TABS: ORAL_TABLET | ORAL | 6 refills | Status: DC

## 2018-10-05 NOTE — Telephone Encounter (Signed)
Message received from Dr. Caryl Comes to send in propafenone 150 mg BID.

## 2018-10-05 NOTE — Telephone Encounter (Signed)
Dr. Caryl Comes- clarify dose on propafenone please!

## 2018-10-05 NOTE — Telephone Encounter (Signed)
Ariel Sprang, MD (Physician)    Called and awakened pt from sleep-- they had awakened to see the sunrise  Discussed the flecainide,  Initially with HA which abated with continuing use and did not resolve following its discontinuation After a month, still having symptoms of "slow HR" and "fast HR"-- not sure but would expect the former is related to ectopy and the latter to sinus tach--although she is known to have atrial ectopy and couplets, no documented atrial tach has yet been seen She also had chest pain noted on phone message 04/19/18>> none since  Hence will declare flecainide a failure  We discussed using propafenone ( and I considered dronaderone--but she has no drug coverage)  Reviewed side effects  She will use her alive[employer] as infor

## 2018-10-06 ENCOUNTER — Other Ambulatory Visit: Payer: Self-pay | Admitting: Internal Medicine

## 2018-10-06 NOTE — Telephone Encounter (Signed)
This is a [place] pt 

## 2018-10-13 ENCOUNTER — Encounter: Payer: Medicare Other | Admitting: Physical Therapy

## 2018-10-17 ENCOUNTER — Encounter: Payer: Medicare Other | Admitting: Physical Therapy

## 2018-10-24 ENCOUNTER — Encounter: Payer: Medicare Other | Admitting: Physical Therapy

## 2018-10-31 ENCOUNTER — Encounter: Payer: Medicare Other | Admitting: Physical Therapy

## 2018-11-07 ENCOUNTER — Encounter: Payer: Medicare Other | Admitting: Physical Therapy

## 2018-11-10 ENCOUNTER — Telehealth: Payer: Self-pay | Admitting: Internal Medicine

## 2018-11-10 ENCOUNTER — Other Ambulatory Visit: Payer: Self-pay

## 2018-11-10 ENCOUNTER — Encounter: Payer: Self-pay | Admitting: Internal Medicine

## 2018-11-10 ENCOUNTER — Ambulatory Visit (INDEPENDENT_AMBULATORY_CARE_PROVIDER_SITE_OTHER): Payer: Medicare Other | Admitting: Internal Medicine

## 2018-11-10 VITALS — BP 132/90 | HR 97 | Ht 64.0 in | Wt 187.0 lb

## 2018-11-10 DIAGNOSIS — I491 Atrial premature depolarization: Secondary | ICD-10-CM

## 2018-11-10 DIAGNOSIS — R55 Syncope and collapse: Secondary | ICD-10-CM

## 2018-11-10 DIAGNOSIS — I10 Essential (primary) hypertension: Secondary | ICD-10-CM

## 2018-11-10 DIAGNOSIS — Z79899 Other long term (current) drug therapy: Secondary | ICD-10-CM

## 2018-11-10 DIAGNOSIS — G901 Familial dysautonomia [Riley-Day]: Secondary | ICD-10-CM

## 2018-11-10 DIAGNOSIS — I493 Ventricular premature depolarization: Secondary | ICD-10-CM | POA: Diagnosis not present

## 2018-11-10 NOTE — Progress Notes (Addendum)
Patient Care Team: Denton Lank, MD as PCP - General (Family Medicine)   HPI  Ariel Bryant is a 48 y.o. female Seen in follow-up for symptoms consistent with dysautonomia. She's had significant GI symptoms and she was referred to [facility].  At the last visit we encouraged exercise salt supplementation and consideration of addressing the psychosocial aspects related to her PTSD   GI symptoms diarrhea nausea vomiting and decreased p.o. intake and had syncope.  While being hooked up to the electrocardiogram she had sinus slowing and sinus arrest.  This occurred on a second occasion.  Saw GI @ [facility] who felt GI symptoms were secondary to dysautonomia   She has had significant palpitations.  She was tried on flecainide which she did not tolerate and was started on propafenone  She did not fill the propafenone prescription.  Headaches were the problem with flecainide but they occurred over time.  Her palpitations have been largely quiescient.  She has been exercising about 30-40 minutes a day.  Has been concerned about blood pressure diastolics in the 0000000  Date Cr K Mg TSH  3/19  3.1    4/19 0.84 4.2 2.3 0.584            Records and Results Reviewed. As above   Past Medical History:  Diagnosis Date  . Anxiety   . GERD (gastroesophageal reflux disease)   . PTSD (post-traumatic stress disorder)   . Sinus tachycardia     Past Surgical History:  Procedure Laterality Date  . BREAST BIOPSY Left 12/28/2016   Affirm Biopsy- Fibrocystic changes. RIbbon clip  . CESAREAN SECTION     NSD x 3 followed by C/S for 8 + pounds breech  . CHOLECYSTECTOMY    . LAPAROSCOPIC OOPHERECTOMY    . VAGINAL HYSTERECTOMY     had BTL, then VH, later developed ovarian mass and had ooph at 48 yo    Current Outpatient Medications  Medication Sig Dispense Refill  . acetaminophen (TYLENOL) 325 MG tablet Take 650 mg by mouth as needed for mild pain, moderate pain, fever or headache.     Marland Kitchen  acyclovir (ZOVIRAX) 400 MG tablet Take 400 mg by mouth as needed (AS NEEDED FOR COLD SORES).     . Ascorbic Acid (VITAMIN C) 1000 MG tablet Take 1,000 mg by mouth daily.    . Biotin 1 MG CAPS Take 1 capsule by mouth daily.    . Calcium Carb-Cholecalciferol (CALCIUM + D3) 600-200 MG-UNIT TABS Take 1 tablet by mouth daily at 2 am.     . cholecalciferol (VITAMIN D3) 25 MCG (1000 UT) tablet Take 1,000 Units by mouth daily.    . clonazePAM (KLONOPIN) 2 MG tablet 2 (two) times daily as needed.    . dicyclomine (BENTYL) 10 MG capsule Take 10 mg by mouth as needed.    . fluticasone (FLONASE) 50 MCG/ACT nasal spray Place 1 spray into both nostrils as needed. For Eustachian Tube Dysfunction    . Garlic (GARLIQUE) [id number] MG TBEC Take by mouth daily.    Marland Kitchen ipratropium (ATROVENT HFA) 17 MCG/ACT inhaler Inhale 2 puffs into the lungs every 4 (four) hours as needed for wheezing.    Marland Kitchen LORAZEPAM PO Take 2 mg by mouth daily.    . magnesium oxide (MAG-OX) 400 MG tablet TAKE 1 TABLET BY MOUTH EVERY DAY (Patient taking differently: once a week. ) 90 tablet 0  . montelukast (SINGULAIR) 10 MG tablet TAKE 1 TABLET BY MOUTH DAILY  FOR ASTHMA AND ALLERGIES    . niacin (SLO-NIACIN) 500 MG tablet Take 500 mg by mouth daily.     . Omega-3 1000 MG CAPS Fish Oil 300 mg-1,000 mg capsule  TAKE ONE CAPSULE TWICE DAILY.    Marland Kitchen omeprazole (PRILOSEC) 40 MG capsule Take 40 mg by mouth daily.    . potassium gluconate 595 (99 K) MG TABS tablet Take 595 mg by mouth daily.     . Turmeric, Curcuma Longa, (TURMERIC ROOT) POWD Take by mouth as needed.     . VENTOLIN HFA 108 (90 Base) MCG/ACT inhaler USE 2 PUFFS EVERY 4-6 HOURS AS NEEDED FOR WHEEZING. MAY SUBSTITUTE INSURANCE FORMULARY.  1  . vitamin B-12 (CYANOCOBALAMIN) 1000 MCG tablet Take 1,000 mcg by mouth as needed.     . zinc gluconate 50 MG tablet Take 50 mg by mouth daily.     No current facility-administered medications for this visit.     Allergies  Allergen Reactions  . Codeine  Shortness Of Breath  . Dilaudid  [Hydromorphone Hcl] Tinitus  . Sulfa Antibiotics Anaphylaxis and Other (See Comments)  . Aspirin Effervescent Nausea And Vomiting  . Azithromycin Other (See Comments)  . Ciprofloxacin Other (See Comments)  . Doxycycline Monohydrate Other (See Comments)  . Erythromycin Other (See Comments)  . Doxycycline Rash      Review of Systems negative except from HPI and PMH  Physical Exam BP 132/90 (BP Location: Left Arm, Patient Position: Sitting, Cuff Size: Normal)   Pulse 97   Ht 5\' 4"  (1.626 m)   Wt 187 lb (84.8 kg)   SpO2 98%   BMI 32.10 kg/m  Well developed and nourished in no acute distress HENT normal Neck supple with JVP-  flat   Clear Regular rate and rhythm, no murmurs or gallops Abd-soft with active BS No Clubbing cyanosis edema Skin-warm and dry A & Oriented  Grossly normal sensory and motor function  ECG   Sinus @@ 97 15/07/35  Assessment and  Plan  Dysautonomia  PVCs/PACs  Palpitations  Monitor>> PACs but no PVCs  Psychosocial stress/PTSD   Sinus Arrest and syncope  Orthostatic Hypotension  hypertension    doing pretty well.  We will have her use of flecainide as a as needed.  She will try 200 mg 1 Saturday morning just to make sure she knows how she feels. \ Trying to avoid medications for blood pressure, will have her try a low-sodium high potassium diet.  Continue low-dose magnesium for cramping of palpitations as her renal function is normal     Current medicines are reviewed at length with the patient today .  The patient does not have concerns regarding medicines.

## 2018-11-10 NOTE — Patient Instructions (Signed)
Medication Instructions:  - Your physician recommends that you continue on your current medications as directed. Please refer to the Current Medication list given to you today.  If you need a refill on your cardiac medications before your next appointment, please call your pharmacy.   Lab work: - none ordered  If you have labs (blood work) drawn today and your tests are completely normal, you will receive your results only by: Marland Kitchen MyChart Message (if you have MyChart) OR . A paper copy in the mail If you have any lab test that is abnormal or we need to change your treatment, we will call you to review the results.  Testing/Procedures: - none ordered  Follow-Up: At [facility], you and your health needs are our priority.  As part of our continuing mission to provide you with exceptional heart care, we have created designated Provider Care Teams.  These Care Teams include your primary Cardiologist (physician) and Advanced Practice Providers (APPs -  Physician Assistants and Nurse Practitioners) who all work together to provide you with the care you need, when you need it.  . You will need a follow up appointment in 6 months (April 2021)  . Please call our office 2 months in advance to schedule this appointment. (Call in early February to schedule)  Any Other Special Instructions Will Be Listed Below (If Applicable). - N/A

## 2018-11-10 NOTE — Telephone Encounter (Signed)
Pt c/o medication issue:  1. Name of Medication: Prazosyn  2. How are you currently taking this medication (dosage and times per day)? Not started yet rx is for 1 mg po  3. Are you having a reaction (difficulty breathing--STAT)? Unknown   4. What is your medication issue?  Patient calling to confirm med talked about at [facility] today with Caryl Comes.

## 2018-11-10 NOTE — Telephone Encounter (Signed)
To Dr. Klein to review. 

## 2018-11-14 ENCOUNTER — Encounter: Payer: Medicare Other | Admitting: Physical Therapy

## 2018-11-15 MED ORDER — LOSARTAN POTASSIUM 25 MG PO TABS: 12.5000 mg | ORAL_TABLET | Freq: Every day | ORAL | 3 refills | Status: DC

## 2018-11-15 NOTE — Telephone Encounter (Signed)
Dr. Caryl Comes called and spoke with the patient. He would like the patient to: 1) start on losartan 25 mg- 1/2 tablet (12.5 mg) once daily 2) BMP- 3 weeks  I called and spoke with the patient to verify the above information and confirm her pharmacy. She voices understanding of the above recommendations. She is aware to go to the [facility] in 3 weeks for lab work.  I have asked her to call back with any further questions/ concerns.

## 2018-11-15 NOTE — Addendum Note (Signed)
Addended by: Alvis Lemmings C on: 11/15/2018 05:04 PM   Modules accepted: Orders

## 2018-11-17 ENCOUNTER — Other Ambulatory Visit: Payer: Self-pay | Admitting: *Deleted

## 2018-11-17 ENCOUNTER — Inpatient Hospital Stay
Admission: RE | Admit: 2018-11-17 | Discharge: 2018-11-17 | Disposition: A | Discharge status: Home / Self Care | Payer: Self-pay | Source: Ambulatory Visit | Attending: *Deleted | Admitting: *Deleted

## 2018-11-17 DIAGNOSIS — Z1231 Encounter for screening mammogram for malignant neoplasm of breast: Secondary | ICD-10-CM

## 2018-11-21 ENCOUNTER — Encounter: Payer: Medicare Other | Admitting: Physical Therapy

## 2018-11-22 ENCOUNTER — Telehealth: Payer: Self-pay | Admitting: Internal Medicine

## 2018-11-22 ENCOUNTER — Other Ambulatory Visit: Payer: Self-pay | Admitting: Family Medicine

## 2018-11-22 NOTE — Telephone Encounter (Signed)
Pt c/o BP issue: STAT if pt c/o blurred vision, one-sided weakness or slurred speech  1. What are your last 5 BP readings?       125/85       120/92      110/91      119/92        118/79      112/78      120/81  2. Are you having any other symptoms (ex. Dizziness, headache, blurred vision, passed out)? Headache feels like someone hit her with a baseball bat,  tingling shocking feeling , nausea   3. What is your BP issue?    Patient says  Losartan has helped systolic bp a little patient states good bp's are when laying down and it goes up higher when sitting to take it   Patient no sure if she needs to change anything please call to advise

## 2018-11-22 NOTE — Telephone Encounter (Signed)
I spoke with the patient. I advised her of Dr. Olin Pia recommendations. She voices understanding and is agreeable.  I have asked her to call back if her SBP readings start to trend upwards and are sustained.

## 2018-11-22 NOTE — Telephone Encounter (Signed)
Have reviewed some literature and there is a 30% chance of overreading esp of diastolic BP  With the systolics in the [id number] range suspect her BP control is really pretty good [employer]

## 2018-11-22 NOTE — Telephone Encounter (Signed)
I called and spoke with the patient. She has been taking losartan 12.5 mg once daily. She called the office today mostly concerned about her DBP readings still being elevated.   10/20- [id number]- 125/85 (lying) before meds 10/19- 0915- 115/80 (lying) before meds          - 1041- 110/91 (sitting) after meds          - 2241- 120/92 (sitting)  10/18- 0922- 112/78 (lying) before meds          - 1830- 119/92 (sitting) 10/17- 1019- 114/79 (lying) before meds          - 2112- 130/91 (sitting)  She does report some headaches, but is uncertain if these are med related as she had some off of losartan. They did improve with tylenol. She is also having sinus drainage.  She reports some nausea upon waking the last 2 mornings along with a tingling sensation to her body when she gets up and urinates- mostly noted when she turns her head for obtain toilet tissue. Symptoms resolve quickly.  I have advised her I am unsure if the symptoms she is reporting are related to her losartan or something underlying.   She again states she was just more concerned about her DBP being elevated. She states her husband was upset with her since she didn't ask Dr. Caryl Comes while she was here why her BP is now elevated. I inquired if she has had any change in her stress level. She states she lost her job when [place] hit. She is mostly staying home and she thinks her weight is up a little bit. She also confirms that her son and daughter in law have separated and " I'm taking it harder than they are."  I advised with her age and stress, this can contribute to her BP being elevated.  She is aware I will review the above with Dr. Caryl Comes and call her back with further MD recommendations.  She voices understanding and is agreeable.

## 2018-11-23 ENCOUNTER — Other Ambulatory Visit: Payer: Self-pay | Admitting: Family Medicine

## 2018-11-23 DIAGNOSIS — N644 Mastodynia: Secondary | ICD-10-CM

## 2018-11-24 ENCOUNTER — Ambulatory Visit: Payer: 59 | Admitting: Internal Medicine

## 2018-11-30 ENCOUNTER — Ambulatory Visit
Admission: RE | Admit: 2018-11-30 | Discharge: 2018-11-30 | Disposition: A | Discharge status: Home / Self Care | Payer: Medicare Other | Source: Ambulatory Visit | Attending: Family Medicine | Admitting: Family Medicine

## 2018-11-30 DIAGNOSIS — N644 Mastodynia: Secondary | ICD-10-CM

## 2018-12-08 ENCOUNTER — Other Ambulatory Visit
Admission: RE | Admit: 2018-12-08 | Discharge: 2018-12-08 | Disposition: A | Discharge status: Home / Self Care | Payer: Medicare Other | Source: Ambulatory Visit | Attending: Internal Medicine | Admitting: Internal Medicine

## 2018-12-08 DIAGNOSIS — Z79899 Other long term (current) drug therapy: Secondary | ICD-10-CM

## 2018-12-08 DIAGNOSIS — I10 Essential (primary) hypertension: Secondary | ICD-10-CM | POA: Diagnosis present

## 2018-12-08 LAB — BASIC METABOLIC PANEL
Anion gap: 11 (ref 5–15)
BUN: 12 mg/dL (ref 6–20)
CO2: 28 mmol/L (ref 22–32)
Calcium: 9.8 mg/dL (ref 8.9–10.3)
Chloride: 101 mmol/L (ref 98–111)
Creatinine, Ser: 0.72 mg/dL (ref 0.44–1.00)
GFR calc Af Amer: >60 mL/min (ref >60)
GFR calc non Af Amer: >60 mL/min (ref >60)
Glucose, Bld: 98 mg/dL (ref 70–99)
Potassium: 3.5 mmol/L (ref 3.5–5.1)
Sodium: 140 mmol/L (ref 135–145)

## 2018-12-09 ENCOUNTER — Other Ambulatory Visit: Payer: Self-pay

## 2018-12-19 ENCOUNTER — Telehealth: Payer: Self-pay | Admitting: Internal Medicine

## 2018-12-19 NOTE — Telephone Encounter (Signed)
Patient calling to discuss recent blood testing results     Patient also reports cramping in Ankles feet shins ( tried Pheraworx but it did not help) and  and calves and a strong urine odor  (drinks plenty of water urine mostly clear interim darker)   Patient was told by pcp Dr. Posey Pronto to stop mag as her labs should high results.   Please call

## 2018-12-22 NOTE — Telephone Encounter (Signed)
I spoke with the patient and advised her that I had reviewed her original message with Dr. Caryl Comes and he felt she needed to follow up with her PCP.  Per the patient, she started losartan ~ 1 month ago and stopped her OTC potassium at that time as she read not to take the 2 together. She was told by her PCP ~ 3 weeks ago her magnesium level was high and to stop her magnesium supplements. She has been having worsening cramping in her legs over the last 1 & 1/2 months.   She notes her urine has been dark recently even though she is drinking water and has had strong odor to it.   I have advised the patient that her last labs with US show her renal function was normal on losartan and her K+ was 3.5. She state she has a tendency for her potassium to drop. I advised her I did not think her cramping was from losartan, but wonder if she may benefit from restarting her magnesium and OTC potassium. I have advised her to go ahead and restart her OTC potassium and to call her PCP to follow up with her symptoms of cramping and dark urine as she may have a UTI.  The patient voices understanding of the above and is agreeable.

## 2019-01-24 ENCOUNTER — Other Ambulatory Visit: Payer: Self-pay | Admitting: Internal Medicine

## 2019-01-30 ENCOUNTER — Telehealth: Payer: Self-pay | Admitting: Internal Medicine

## 2019-01-30 NOTE — Telephone Encounter (Signed)
Patient calling in regarding medication, losartan pottasium. Patient is taking 1/2 tablet every morning and BP is still running high. When patient is laying flat down that is the only time her BP is good.   Patient calling wanting to discuss medication and potential changes  Please advise. She would like to know which dosage to take tomorrow morning

## 2019-01-30 NOTE — Telephone Encounter (Signed)
mychart message sent to request vs from patient to further advise on medications.

## 2019-02-01 NOTE — Telephone Encounter (Signed)
Attempted to call patient. LMTCB 02/01/2019

## 2019-02-06 NOTE — Telephone Encounter (Signed)
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 Having headaches "all over". VS running HR 76-108 (higher w standing) BP 120-130/88-90s Highest BP was on 1/2 @ 10 AM 145/104.   Pt does not feel Losartan is effective. Starting taking supplements 4 days ago (bountiful beets and Co Q 10).   She also c/o leg swelling, worse over the past 2-3 days.   She denies and vision changes, dizziness or chest pain. No recent illness or stress.   Would like to see if another medication would be helpful.   Routing to Dr. Caryl Comes to further advise.

## 2019-02-06 NOTE — Telephone Encounter (Signed)
Patient returning call to discuss bp and medications .

## 2019-02-07 NOTE — Telephone Encounter (Signed)
Patient calling back in with one medication she forgot to add  Quercetin 500 mg (not yet started)

## 2019-02-07 NOTE — Telephone Encounter (Signed)
I spoke with the patient. She states that she has been having some variation in her BP. She had a reading of 125/95 one day and then her night time reading was 145/104. She has checked orthostatic readings yesterday and she was: Lying- 113/80 Sitting- 114/87 Standing- monitor would not register nor could her husband get a manual reading.  Last night her BP was 126/95.  She is currently taking losartan 12.5 mg once daily in the morning at 0930.   The patient states her only med changes are that she has been taking: - Bountiful Beets (for energy) - Goli apple cider vinegar gummies (for stomach) - Qunol CoQ10  She states overall she feels ok. She has occasional headaches and states stress could be an underlying cause for her BP flucuations.  I advised her that stress can certainly play a role in BP variation and overall health. I inquired if she is exercising at all- she states she has a treadmill that she hasn't been using through the holidays. She is taking a small dose of clonazepam at night.   I have advised her I will review her BP readings with Dr. Caryl Comes and call her back later today. She has had elevated DBP readings (80-90) as far back as March in [place].  The patient voices understanding of the above and is agreeable.

## 2019-02-07 NOTE — Telephone Encounter (Signed)
I reviewed the information below with Dr. Caryl Comes. Per Dr. Caryl Comes, he would prefer for the patient's BP's to run a little higher at this time due to her orthostasis. He recommends no medication changes at this time.  I have reviewed MD recommendations with the patient. She voices understanding and is agreeable.

## 2019-02-24 ENCOUNTER — Telehealth: Payer: Self-pay | Admitting: Internal Medicine

## 2019-02-24 NOTE — Telephone Encounter (Signed)
I spoke with the patient. She called stating her "pulse pressure has been 19-20" for about a week.  She is not feeling well.  She is still having episodes with her blood pressure not registering on her monitor when she stands.  She tried to get a reading this morning and was unable to. She put on her compression socks and then got a reading of 108/89.   BP's have been averaging 110-120/ 80-90 for the most part.  The patient states she just doesn't feel well and was wanting to see if she would be ok to hold the losartan 12.5 mg once daily.  I advised her that if she is not feeling well and not able to get BP readings to register on her cuff at times, then I am afraid she is having some low readings. She does sound shaky in her voice this morning.  I advised her to: - hold losartan today & tomorrow to see how she feels - push fluid & add a bit of salt to her diet - wear compression socks.  I told the patient that if she checks her BP #'s and they are running a little higher off losartan, not to panic. I am more concerned with how she is feeling, hopefully better, with the recommendations above.   The patient is agreeable and she is also requesting a video visit with Dr. Caryl Comes for him to explain things to her about her condition in more detail.  I have advised that is fine- appt scheduled for 02/28/19 at [facility]. I have asked that she take orthostatic readings prior to her visit and have those available for her appt.   The patient voices understanding and was appreciative for the call back.

## 2019-02-24 NOTE — Telephone Encounter (Signed)
Patient states her pulse pressure has been in the 20s for over a week. Please call to discuss.

## 2019-02-28 ENCOUNTER — Encounter: Payer: Self-pay | Admitting: Internal Medicine

## 2019-02-28 ENCOUNTER — Telehealth (INDEPENDENT_AMBULATORY_CARE_PROVIDER_SITE_OTHER): Payer: Medicare Other | Admitting: Internal Medicine

## 2019-02-28 ENCOUNTER — Other Ambulatory Visit: Payer: Self-pay

## 2019-02-28 VITALS — BP 113/87 | HR 78 | Ht 64.5 in | Wt 185.0 lb

## 2019-02-28 DIAGNOSIS — I493 Ventricular premature depolarization: Secondary | ICD-10-CM | POA: Diagnosis not present

## 2019-02-28 DIAGNOSIS — I491 Atrial premature depolarization: Secondary | ICD-10-CM | POA: Diagnosis not present

## 2019-02-28 DIAGNOSIS — I10 Essential (primary) hypertension: Secondary | ICD-10-CM | POA: Diagnosis not present

## 2019-02-28 DIAGNOSIS — I951 Orthostatic hypotension: Secondary | ICD-10-CM

## 2019-02-28 DIAGNOSIS — G901 Familial dysautonomia [Riley-Day]: Secondary | ICD-10-CM | POA: Diagnosis not present

## 2019-02-28 NOTE — Progress Notes (Signed)
Electrophysiology TeleHealth Note   Due to national recommendations of social distancing due to COVID 19, an audio/video telehealth visit is felt to be most appropriate for this patient at this time.  See MyChart message from today for the patient's consent to telehealth for [facility].   Date:  02/28/2019   ID:  Ariel Bryant, DOB Jan 16, 1971, MRN [id number]  Location: patient's home  Provider location: [address], [place]  Evaluation Performed: Follow-up visit  PCP:  Denton Lank, MD  Cardiologist:     Electrophysiologist:  SK   Chief Complaint:     History of Present Illness:    Ariel Bryant is a 49 y.o. female who presents via audio/video conferencing for a telehealth visit today.  Since last being seen in our clinic for dysautonomia and palpitations with hypertension with orthostasis  the patient reports doing beautifully, following initially our add losartan 25 for BP-diastolic and then subsequently stopping it  BP have been better, without demonstrable orthostasis, Nausea with standing resolved and overall feels much better  Chest discomfort--tightness resolved-- not w exertion    Significant GI symptoms and she was referred to [facility]. Have encouraged  Exercise-- now doing 4-5d/ wk,  salt supplementation   addressing the psychosocial aspects related to her PTSD and compression wear.   GI symptoms diarrhea nausea vomiting and decreased p.o. intake and had syncope.  While being hooked up to the electrocardiogram she had sinus slowing and sinus arrest.  This occurred on a second occasion.    She has had significant palpitations.  She was tried on flecainide which she did not tolerate and was started on propafenone-- these are quiet, just a few times/month      Date Cr K Mg TSH  3/19  3.1    4/19 0.84 4.2 2.3 0.584  11/20  0.72 3.5      The patient denies symptoms of fevers, chills, cough, or new SOB worrisome for COVID 19.    Past  Medical History:  Diagnosis Date  . Anxiety   . GERD (gastroesophageal reflux disease)   . PTSD (post-traumatic stress disorder)   . Sinus tachycardia     Past Surgical History:  Procedure Laterality Date  . BREAST BIOPSY Left 12/28/2016   Affirm Biopsy- Fibrocystic changes. RIbbon clip  . CESAREAN SECTION     NSD x 3 followed by C/S for 8 + pounds breech  . CHOLECYSTECTOMY    . LAPAROSCOPIC OOPHERECTOMY    . VAGINAL HYSTERECTOMY     had BTL, then VH, later developed ovarian mass and had ooph at 49 yo    Current Outpatient Medications  Medication Sig Dispense Refill  . acetaminophen (TYLENOL) 325 MG tablet Take 650 mg by mouth as needed for mild pain, moderate pain, fever or headache.     Marland Kitchen acyclovir (ZOVIRAX) 400 MG tablet Take 400 mg by mouth as needed (AS NEEDED FOR COLD SORES).     . Ascorbic Acid (VITAMIN C) 1000 MG tablet Take 1,000 mg by mouth daily.    . Biotin 1 MG CAPS Take 1 capsule by mouth daily.    . Calcium Carb-Cholecalciferol (CALCIUM + D3) 600-200 MG-UNIT TABS Take 1 tablet by mouth daily at 2 am.     . cholecalciferol (VITAMIN D3) 25 MCG (1000 UT) tablet Take 1,000 Units by mouth daily.    . clonazePAM (KLONOPIN) 2 MG tablet 2 (two) times daily as needed.    . Coenzyme Q10 (CO Q-10  PO) Take by mouth daily.    Marland Kitchen dicyclomine (BENTYL) 10 MG capsule Take 10 mg by mouth as needed.    . fluticasone (FLONASE) 50 MCG/ACT nasal spray Place 1 spray into both nostrils as needed. For Eustachian Tube Dysfunction    . fluticasone (FLOVENT HFA) 110 MCG/ACT inhaler Inhale into the lungs 2 (two) times daily.    . Garlic (GARLIQUE) A999333 MG TBEC Take by mouth as needed.     Marland Kitchen LORAZEPAM PO Take 2 mg by mouth daily as needed.     . magnesium oxide (MAG-OX) 400 MG tablet TAKE 1 TABLET BY MOUTH EVERY DAY (Patient taking differently: Twice weekly) 90 tablet 0  . montelukast (SINGULAIR) 10 MG tablet TAKE 1 TABLET BY MOUTH DAILY FOR ASTHMA AND ALLERGIES    . niacin (SLO-NIACIN) 500 MG  tablet Take 500 mg by mouth daily.     . NON FORMULARY GOLI 1 gummy twice daily.    . NON FORMULARY [place]ount[place] Beets 1 twice daily.    . Omega-3 1000 MG CAPS Fish Oil 300 mg-1,000 mg capsule  TAKE ONE CAPSULE TWICE DAILY.    Marland Kitchen omeprazole (PRILOSEC) 40 MG capsule Take 40 mg by mouth daily.    . potassium gluconate 595 (99 K) MG TABS tablet Take 595 mg by mouth daily.     . Turmeric, Curcuma Longa, (TURMERIC ROOT) POWD Take by mouth as needed.     . VENTOLIN HFA 108 (90 Base) MCG/ACT inhaler 2 (two) times daily.   1  . vitamin B-12 (CYANOCOBALAMIN) 1000 MCG tablet Take 1,000 mcg by mouth as needed.     . zinc gluconate 50 MG tablet Take 50 mg by mouth daily.     No current facility-administered medications for this visit.    Allergies:   Codeine, Dilaudid  [hydromorphone hcl], Sulfa antibiotics, Aspirin effervescent, Azithromycin, Ciprofloxacin, Doxycycline monohydrate, Erythromycin, and Doxycycline   Social History:  The patient  reports that she has quit smoking. She has never used smokeless tobacco. She reports that she does not drink alcohol or use drugs.   Family History:  The patient's   family history includes Breast cancer in her paternal aunt and paternal aunt; Breast cancer (age of onset: 72) in her maternal aunt; Hypertension in her father.   ROS:  Please see the history of present illness.   All other systems are personally reviewed and negative.    Exam:    Vital Signs:  BP 113/87 (BP Location: Left Arm, Patient Position: Sitting, Cuff Size: Normal)   Pulse 78   Ht 5' 4.5" (1.638 m)   Wt 185 lb (83.9 kg)   BMI 31.26 kg/m     Well appearing, alert and conversant, regular work of breathing,  good skin color Eyes- anicteric, neuro- grossly intact, skin- no apparent rash or lesions or cyanosis, mouth- oral mucosa is pink   Labs/Other Tests and Data Reviewed:    Recent Labs: 12/08/2018: BUN 12; Creatinine, Ser 0.72; Potassium 3.5; Sodium 140   Wt Readings from Last 3  Encounters:  02/28/19 185 lb (83.9 kg)  11/10/18 187 lb (84.8 kg)  09/08/18 182 lb (82.6 kg)     Other studies personally reviewed:  *    ASSESSMENT & PLAN:    Dysautonomia  PVCs/PACs  Palpitations  Monitor>> PACs but no PVCs  Psychosocial stress/PTSD   Sinus Arrest and syncope  Orthostatic Hypotension  hypertension    BP and orthostasis are much better OFF losartan I wonder whether her diastolic  BP and narrow BP may have been related to efforts to ward off hypotension and hypovolemia--  Palps are also quiet  Continue exercise, current meds  COVID 19 screen The patient denies symptoms of COVID 19 at this time.  The importance of social distancing was discussed today.  Follow-up:  As Scheduled for recall in 4/21    Current medicines are reviewed at length with the patient today.   The patient does not have concerns regarding her medicines.  The following changes were made today:  none  Labs/ tests ordered today include:   No orders of the defined types were placed in this encounter.   Future tests ( post COVID )    Patient Risk:  after full review of this patients clinical status, I feel that they are at moderate  risk at this time.  Today, I have spent 7 minutes with the patient with telehealth technology discussing the above.  Signed, Virl Axe, MD  02/28/2019 8:50 AM     [facility] [address] [place] [address] [phone] (office) [phone] (fax)        Patient Care Team: Denton Lank, MD as PCP - General (Family Medicine)   HPI  Ariel Bryant is a 49 y.o. female Seen in follow-up for symptoms consistent with dysautonomia. She's had significant GI symptoms and she was referred to [facility].  At the last visit we encouraged exercise salt supplementation and consideration of addressing the psychosocial aspects related to her PTSD   GI symptoms diarrhea nausea vomiting and decreased p.o. intake and  had syncope.  While being hooked up to the electrocardiogram she had sinus slowing and sinus arrest.  This occurred on a second occasion.  Saw GI @ [facility] who felt GI symptoms were secondary to dysautonomia   She has had significant palpitations.  She was tried on flecainide which she did not tolerate and was started on propafenone  She did not fill the propafenone prescription.  Headaches were the problem with flecainide but they occurred over time.  Her palpitations have been largely quiescient.  She has been exercising about 30-40 minutes a day.  Has been concerned about blood pressure diastolics in the 0000000  Date Cr K Mg TSH  3/19  3.1    4/19 0.84 4.2 2.3 0.584            Records and Results Reviewed. As above   Past Medical History:  Diagnosis Date  . Anxiety   . GERD (gastroesophageal reflux disease)   . PTSD (post-traumatic stress disorder)   . Sinus tachycardia     Past Surgical History:  Procedure Laterality Date  . BREAST BIOPSY Left 12/28/2016   Affirm Biopsy- Fibrocystic changes. RIbbon clip  . CESAREAN SECTION     NSD x 3 followed by C/S for 8 + pounds breech  . CHOLECYSTECTOMY    . LAPAROSCOPIC OOPHERECTOMY    . VAGINAL HYSTERECTOMY     had BTL, then VH, later developed ovarian mass and had ooph at 49 yo    Current Outpatient Medications  Medication Sig Dispense Refill  . acetaminophen (TYLENOL) 325 MG tablet Take 650 mg by mouth as needed for mild pain, moderate pain, fever or headache.     Marland Kitchen acyclovir (ZOVIRAX) 400 MG tablet Take 400 mg by mouth as needed (AS NEEDED FOR COLD SORES).     . Ascorbic Acid (VITAMIN C) 1000 MG tablet Take 1,000 mg by mouth daily.    . Biotin  1 MG CAPS Take 1 capsule by mouth daily.    . Calcium Carb-Cholecalciferol (CALCIUM + D3) 600-200 MG-UNIT TABS Take 1 tablet by mouth daily at 2 am.     . cholecalciferol (VITAMIN D3) 25 MCG (1000 UT) tablet Take 1,000 Units by mouth daily.    . clonazePAM (KLONOPIN) 2 MG tablet 2 (two)  times daily as needed.    . Coenzyme Q10 (CO Q-10 PO) Take by mouth daily.    Marland Kitchen dicyclomine (BENTYL) 10 MG capsule Take 10 mg by mouth as needed.    . fluticasone (FLONASE) 50 MCG/ACT nasal spray Place 1 spray into both nostrils as needed. For Eustachian Tube Dysfunction    . fluticasone (FLOVENT HFA) 110 MCG/ACT inhaler Inhale into the lungs 2 (two) times daily.    . Garlic (GARLIQUE) A999333 MG TBEC Take by mouth as needed.     Marland Kitchen LORAZEPAM PO Take 2 mg by mouth daily as needed.     . magnesium oxide (MAG-OX) 400 MG tablet TAKE 1 TABLET BY MOUTH EVERY DAY (Patient taking differently: Twice weekly) 90 tablet 0  . montelukast (SINGULAIR) 10 MG tablet TAKE 1 TABLET BY MOUTH DAILY FOR ASTHMA AND ALLERGIES    . niacin (SLO-NIACIN) 500 MG tablet Take 500 mg by mouth daily.     . NON FORMULARY GOLI 1 gummy twice daily.    . NON FORMULARY [place]ount[place] Beets 1 twice daily.    . Omega-3 1000 MG CAPS Fish Oil 300 mg-1,000 mg capsule  TAKE ONE CAPSULE TWICE DAILY.    Marland Kitchen omeprazole (PRILOSEC) 40 MG capsule Take 40 mg by mouth daily.    . potassium gluconate 595 (99 K) MG TABS tablet Take 595 mg by mouth daily.     . Turmeric, Curcuma Longa, (TURMERIC ROOT) POWD Take by mouth as needed.     . VENTOLIN HFA 108 (90 Base) MCG/ACT inhaler 2 (two) times daily.   1  . vitamin B-12 (CYANOCOBALAMIN) 1000 MCG tablet Take 1,000 mcg by mouth as needed.     . zinc gluconate 50 MG tablet Take 50 mg by mouth daily.     No current facility-administered medications for this visit.    Allergies  Allergen Reactions  . Codeine Shortness Of Breath  . Dilaudid  [Hydromorphone Hcl] Tinitus  . Sulfa Antibiotics Anaphylaxis and Other (See Comments)  . Aspirin Effervescent Nausea And Vomiting  . Azithromycin Other (See Comments)  . Ciprofloxacin Other (See Comments)  . Doxycycline Monohydrate Other (See Comments)  . Erythromycin Other (See Comments)  . Doxycycline Rash      Review of Systems negative except from HPI and  PMH  Physical Exam BP 113/87 (BP Location: Left Arm, Patient Position: Sitting, Cuff Size: Normal)   Pulse 78   Ht 5' 4.5" (1.638 m)   Wt 185 lb (83.9 kg)   BMI 31.26 kg/m  Well developed and nourished in no acute distress HENT normal Neck supple with JVP-  flat   Clear Regular rate and rhythm, no murmurs or gallops Abd-soft with active BS No Clubbing cyanosis edema Skin-warm and dry A & Oriented  Grossly normal sensory and motor function  ECG   Sinus @@ 97 15/07/35  Assessment and  Plan  Dysautonomia  PVCs/PACs  Palpitations  Monitor>> PACs but no PVCs  Psychosocial stress/PTSD   Sinus Arrest and syncope  Orthostatic Hypotension  hypertension    doing pretty well.  We will have her use of flecainide as a as needed.  She  will try 200 mg 1 Saturday morning just to make sure she knows how she feels. \ Trying to avoid medications for blood pressure, will have her try a low-sodium high potassium diet.  Continue low-dose magnesium for cramping of palpitations as her renal function is normal     Current medicines are reviewed at length with the patient today .  The patient does not have concerns regarding medicines.

## 2019-03-10 MED ORDER — FLECAINIDE ACETATE 50 MG PO TABS: ORAL_TABLET | ORAL | Status: DC

## 2019-03-10 NOTE — Addendum Note (Signed)
Addended by: Alvis Lemmings C on: 03/10/2019 03:04 PM   Modules accepted: Orders

## 2019-03-10 NOTE — Patient Instructions (Signed)
Medication Instructions:  - Your physician has recommended you make the following change in your medication:   1) take flecainide 50 mg- 1 tablet twice daily as needed for palpitations  *If you need a refill on your cardiac medications before your next appointment, please call your pharmacy*  Lab Work: - none ordered  If you have labs (blood work) drawn today and your tests are completely normal, you will receive your results only by: Marland Kitchen MyChart Message (if you have MyChart) OR . A paper copy in the mail If you have any lab test that is abnormal or we need to change your treatment, we will call you to review the results.  Testing/Procedures: - none ordered  Follow-Up: At [facility], you and your health needs are our priority.  As part of our continuing mission to provide you with exceptional heart care, we have created designated Provider Care Teams.  These Care Teams include your primary Cardiologist (physician) and Advanced Practice Providers (APPs -  Physician Assistants and Nurse Practitioners) who all work together to provide you with the care you need, when you need it.  Your next appointment:   April 2021   The format for your next appointment:   In Person  Provider:   Virl Axe, MD  Other Instructions -  try a low-sodium,  high potassium diet.  - continue low-dose magnesium for cramping

## 2019-03-15 ENCOUNTER — Other Ambulatory Visit: Payer: Self-pay | Admitting: Internal Medicine

## 2019-04-03 ENCOUNTER — Telehealth: Payer: Self-pay | Admitting: Internal Medicine

## 2019-04-03 NOTE — Telephone Encounter (Signed)
Patient requesting to speak with Nira Conn Would like to discuss if an over the counter medication will be ok to take - no details were given when asked Please call to discuss

## 2019-04-04 NOTE — Telephone Encounter (Signed)
I spoke with the patient.  She states it was recommended to her by a family member to try: 1) CBD oil (that you can vape/ get from a vape store) 2) & Copaiba (an essential oil)  For her stress/ anxiety.  The patient is wanting to get off of her RX anxiety meds if she can, but wanted to see how these things might effect her heart.   I advised the patient that I will review with pharmacy to see if they have any thoughts on either of these products. She is aware I know very little about the CBD and I have not heard of the Copaiba essential oil.  She was appreciative for the call back and is aware I will reach back out to her once I get any type of feedback.

## 2019-04-04 NOTE — Telephone Encounter (Signed)
I spoke with the patient and advised her of pharmacy feedback/ recommendations regarding CBD oil & Copaiba.   The patient was very appreciative for the information and call back.

## 2019-04-04 NOTE — Telephone Encounter (Addendum)
I would discourage the use of either one. There is limited evidence in the efficacy of either one.  There is potential for drug interactions for several of her medications with CBD oil (can increase concentrations of flecainide). CBD oil is also not regulated well and there is a chance she could buy a product that is under or overdosed. There has also been an issue with CBD vape pens containing Vitamin E which can cause lung disorders. Although I would encourage patient to get off of her benzodiazepines (clonazepam, lorazepam) I think her PTSD/anxiety needs to be address with a psychologist or psychiatrist.

## 2019-04-11 ENCOUNTER — Telehealth: Payer: Self-pay | Admitting: Internal Medicine

## 2019-04-11 NOTE — Telephone Encounter (Signed)
ROI form received from the patient through [place].  I responded back to the patient through a MyChart message.  Will close this encounter.

## 2019-04-11 NOTE — Telephone Encounter (Signed)
Patient having increased episodes with POTS and patient husband needs interim FMLA forms if klein will sign   Patient wants to discuss validity with heather .    Scanned ROI to patient to complete and return to mychart with forms.  She would like to upload to expedite.

## 2019-04-17 ENCOUNTER — Telehealth: Payer: Self-pay | Admitting: Internal Medicine

## 2019-04-17 DIAGNOSIS — Z79899 Other long term (current) drug therapy: Secondary | ICD-10-CM

## 2019-04-17 NOTE — Telephone Encounter (Signed)
Pt c/o BP issue: STAT if pt c/o blurred vision, one-sided weakness or slurred speech  1. What are your last 5 BP readings?   Does not have them with her 141/90 last night. 115/92, 112/93 this morning sitting  2. Are you having any other symptoms (ex. Dizziness, headache, blurred vision, passed out)? Headache (hossible at base of skull)  3. What is your BP issue? Her bottom number seems to be "creeping up"  and now patient is feeling weaker

## 2019-04-17 NOTE — Telephone Encounter (Signed)
Spoke with patient. Patient has many concerns and issues.  1. She is concerned with her current blood pressure readings. Her last three BP readings were 140/90, 115/92, and 112/93. She also states that she had a headache last night and she attributed this to her BP being 140/90. I did inform the patient that I did not think her BP was the cause of this headache. She states that she has been under a lot of stress and this could be the cause of the headache.  2. She is concerned with her heart rate. She states that her heart rate has been between 107-120's while at rest. She has not made any changes to her medications  3. Patient has complaints of bilateral hand swelling and swelling from the knees down bilaterally. She states that she has been wearing compression stockings. I advised her to limit her salt intake and to elevate her extremities as much as possible.  4. She states that she has had several instances where she felt something "electrical" in her body. She said it only lasts a few seconds and has only happened a couple of times.    Routing to Dr Caryl Comes for further review

## 2019-04-17 NOTE — Telephone Encounter (Signed)
Ladies   maybe we can see her 3/25   I dont see a possibility before that  She could see her PCP   Also need CbC and TSH given her report of tachycardia And maybe a 5 day Zio would be informing  Thanks SK

## 2019-04-18 NOTE — Telephone Encounter (Signed)
Spoke with the patient regarding Dr Aquilla Hacker recommendations. I am going to route this message to scheduling to get her scheduled with Dr Caryl Comes as soon as possible. She has agreed [facility]. I let her know that she does not need an appointment to get these done and gave her their hours of operation. She has refused to wear the Zio monitor at this time. She states that she would like to come into the office to see Dr Caryl Comes before deciding to wear the monitor.   Patient states that she would like to discuss her concern that she may have a pulmonary embolism. She states that she has been feeling weak over the last several days and has been short of breath at times. I am routing this message to Dr Caryl Comes to review and to let us know if there is anything we should do in regards to her concern for a PE before her office visit?  She speaks in full sentences on the phone and no audible distress is noted. She did not have any vital signs available at this time but states that her heart rate has been in the low 100's.

## 2019-04-18 NOTE — Telephone Encounter (Signed)
Spoke with patient .  Scheduled 4/8 previously and this is the next available.  Added to waitlist.  Patient states if she feels like she is getting worse or is needed emergency treatment she will call 911 or go to the emergency room.

## 2019-04-19 ENCOUNTER — Other Ambulatory Visit
Admission: RE | Admit: 2019-04-19 | Discharge: 2019-04-19 | Disposition: A | Discharge status: Home / Self Care | Payer: Medicare HMO | Source: Ambulatory Visit | Attending: Internal Medicine | Admitting: Internal Medicine

## 2019-04-19 DIAGNOSIS — Z79899 Other long term (current) drug therapy: Secondary | ICD-10-CM | POA: Insufficient documentation

## 2019-04-19 LAB — CBC
HCT: 41.9 % (ref 36.0–46.0)
Hemoglobin: 13.4 g/dL (ref 12.0–15.0)
MCH: 27.2 pg (ref 26.0–34.0)
MCHC: 32 g/dL (ref 30.0–36.0)
MCV: 85 fL (ref 80.0–100.0)
Platelets: 270 10*3/uL (ref 150–400)
RBC: 4.93 MIL/uL (ref 3.87–5.11)
RDW: 12.9 % (ref 11.5–15.5)
WBC: 6.4 10*3/uL (ref 4.0–10.5)
nRBC: 0 % (ref 0.0–0.2)

## 2019-04-19 LAB — TSH: TSH: 1.506 u[IU]/mL (ref 0.350–4.500)

## 2019-04-19 NOTE — Telephone Encounter (Signed)
Secure chat sent to [facility] yesterday asking if she would contact the patient and offer her an appointment with Dr. Caryl Comes 04/20/19 @ 0840.  The patient is currently scheduled to be seen at that time.

## 2019-04-19 NOTE — Addendum Note (Signed)
Addended by: Verlon Au on: 04/19/2019 11:22 AM   Modules accepted: Orders

## 2019-04-20 ENCOUNTER — Ambulatory Visit (INDEPENDENT_AMBULATORY_CARE_PROVIDER_SITE_OTHER): Payer: Medicare HMO | Admitting: Internal Medicine

## 2019-04-20 ENCOUNTER — Other Ambulatory Visit: Payer: Self-pay

## 2019-04-20 ENCOUNTER — Encounter: Payer: Self-pay | Admitting: Internal Medicine

## 2019-04-20 VITALS — BP 112/82 | HR 82 | Ht 64.0 in | Wt 190.0 lb

## 2019-04-20 DIAGNOSIS — M7989 Other specified soft tissue disorders: Secondary | ICD-10-CM

## 2019-04-20 DIAGNOSIS — G901 Familial dysautonomia [Riley-Day]: Secondary | ICD-10-CM | POA: Diagnosis not present

## 2019-04-20 DIAGNOSIS — I493 Ventricular premature depolarization: Secondary | ICD-10-CM | POA: Diagnosis not present

## 2019-04-20 DIAGNOSIS — I10 Essential (primary) hypertension: Secondary | ICD-10-CM

## 2019-04-20 DIAGNOSIS — I951 Orthostatic hypotension: Secondary | ICD-10-CM

## 2019-04-20 DIAGNOSIS — R072 Precordial pain: Secondary | ICD-10-CM

## 2019-04-20 DIAGNOSIS — I491 Atrial premature depolarization: Secondary | ICD-10-CM | POA: Diagnosis not present

## 2019-04-20 MED ORDER — METOPROLOL TARTRATE 100 MG PO TABS: ORAL_TABLET | ORAL | 0 refills | Status: DC

## 2019-04-20 NOTE — Progress Notes (Signed)
Patient Care Team: Denton Lank, MD as PCP - General (Family Medicine)   HPI  Ariel Bryant is a 49 y.o. female Seen in follow-up for symptoms consistent with dysautonomia. She's had significant GI symptoms and she was referred to [facility].  At the last visit we encouraged exercise salt supplementation and consideration of addressing the psychosocial aspects related to her PTSD   GI symptoms diarrhea nausea vomiting and decreased p.o. intake and had syncope.  While being hooked up to the electrocardiogram she had sinus slowing and sinus arrest.  This occurred on a second occasion.  Saw GI @ [facility] who felt GI symptoms were secondary to dysautonomia  When seen by telehealth 1/21 she reported doing significantly better.  Notable for orthostasis and less nausea with standing.  Still with significant palpitations.  She has not tolerated daily flecainide was treated with propafenone  Over the last 3-4 weeks she has struggled with increasing anxiety.  This is been associated with swelling of hands and feet.  There has been some dyspnea aggravated by exertion as well as by lying flat.  She has had some chest pressure associated with exertion with some radiation down her left arm.  She also has a similar chest discomfort, although she describes the first as pressure and no second discomfort when she lies flat as pain.  She is also found herself to be inordinately sleepy  Also complains of palpitations.  These are relatively infrequent.  She has noted swelling also in her legs with some pain in her legs.    Date Cr K Mg Hgb TSH  3/19  3.1     4/19 0.84 4.2 2.3 13.4 0.584  11/20  0.72 3.5   1.5    She is intermittently in tears as we discussed the struggles  Records and Results Reviewed. As above   Past Medical History:  Diagnosis Date  . Anxiety   . GERD (gastroesophageal reflux disease)   . PTSD (post-traumatic stress disorder)   . Sinus tachycardia     Past Surgical  History:  Procedure Laterality Date  . BREAST BIOPSY Left 12/28/2016   Affirm Biopsy- Fibrocystic changes. RIbbon clip  . CESAREAN SECTION     NSD x 3 followed by C/S for 8 + pounds breech  . CHOLECYSTECTOMY    . LAPAROSCOPIC OOPHERECTOMY    . VAGINAL HYSTERECTOMY     had BTL, then VH, later developed ovarian mass and had ooph at 49 yo    Current Outpatient Medications  Medication Sig Dispense Refill  . acetaminophen (TYLENOL) 325 MG tablet Take 650 mg by mouth as needed for mild pain, moderate pain, fever or headache.     Marland Kitchen acyclovir (ZOVIRAX) 400 MG tablet Take 400 mg by mouth as needed (AS NEEDED FOR COLD SORES).     . Ascorbic Acid (VITAMIN C) 1000 MG tablet Take 1,000 mg by mouth daily.    . Biotin 1 MG CAPS Take 1 capsule by mouth daily.    . Calcium Carb-Cholecalciferol (CALCIUM + D3) 600-200 MG-UNIT TABS Take 1 tablet by mouth daily at 2 am.     . cholecalciferol (VITAMIN D3) 25 MCG (1000 UT) tablet Take 1,000 Units by mouth daily.    . clonazePAM (KLONOPIN) 2 MG tablet 2 (two) times daily as needed.    . Coenzyme Q10 (CO Q-10 PO) Take by mouth daily.    Marland Kitchen dicyclomine (BENTYL) 10 MG capsule Take 10 mg by mouth as needed.    Marland Kitchen  fluticasone (FLONASE) 50 MCG/ACT nasal spray Place 1 spray into both nostrils as needed. For Eustachian Tube Dysfunction    . fluticasone (FLOVENT HFA) 110 MCG/ACT inhaler Inhale into the lungs 2 (two) times daily.    . Garlic (GARLIQUE) [id number] MG TBEC Take by mouth as needed.     Marland Kitchen LORAZEPAM PO Take 2 mg by mouth daily as needed.     . magnesium oxide (MAG-OX) 400 MG tablet TAKE 1 TABLET BY MOUTH EVERY DAY 90 tablet 0  . montelukast (SINGULAIR) 10 MG tablet TAKE 1 TABLET BY MOUTH DAILY FOR ASTHMA AND ALLERGIES    . niacin (SLO-NIACIN) 500 MG tablet Take 500 mg by mouth daily.     . NON FORMULARY GOLI 1 gummy twice daily.    . NON FORMULARY [place] Beets 1 twice daily.    . Omega-3 1000 MG CAPS Fish Oil 300 mg-1,000 mg capsule  TAKE ONE CAPSULE TWICE  DAILY.    Marland Kitchen omeprazole (PRILOSEC) 40 MG capsule Take 40 mg by mouth daily.    . potassium gluconate 595 (99 K) MG TABS tablet Take 595 mg by mouth daily.     . Turmeric, Curcuma Longa, (TURMERIC ROOT) POWD Take by mouth as needed.     . vitamin B-12 (CYANOCOBALAMIN) 1000 MCG tablet Take 1,000 mcg by mouth as needed.     . zinc gluconate 50 MG tablet Take 50 mg by mouth daily.     No current facility-administered medications for this visit.    Allergies  Allergen Reactions  . Codeine Shortness Of Breath  . Dilaudid  [Hydromorphone Hcl] Tinitus  . Sulfa Antibiotics Anaphylaxis and Other (See Comments)  . Aspirin Effervescent Nausea And Vomiting  . Azithromycin Other (See Comments)  . Ciprofloxacin Other (See Comments)  . Doxycycline Monohydrate Other (See Comments)  . Erythromycin Other (See Comments)  . Doxycycline Rash      Review of Systems negative except from HPI and PMH  Physical Exam BP 112/82 (BP Location: Left Arm, Patient Position: Sitting, Cuff Size: Normal)   Pulse 82   Ht 5\' 4"  (1.626 m)   Wt 190 lb (86.2 kg)   BMI 32.61 kg/m  Well developed and nourished in no acute distress HENT normal Neck supple with JVP-6-7 cm Clear Regular rate and rhythm, no murmurs or gallops Abd-soft with active BS No Clubbing cyanosis edema Skin-warm and dry A & Oriented  Grossly normal sensory and motor function  ECG sinus @ 82 14/07/38   Assessment and  Plan  Dysautonomia  PVCs/PACs  Palpitations  Monitor>> PACs but no PVCs  Psychosocial stress/PTSD   Sinus Arrest and syncope  Orthostatic Hypotension  Hypertension  Chest pain   Swelling  Chest pain has typical and atypical features.  Given the degree of accompanying anxiety, have recommended that we undergo CTA as opposed to Myoview scanning or calcium scoring.  There enthusiastically in agreement.  Swelling is not so readily apparent to me.  Weight is up 10 pounds.  Affirmed her decision to decrease sodium  intake.  Encouraged decreased p.o. intake at this point.  We will check albumin, renal function and UA to look for noncardiac causes for her edema; interestingly, she has more aware of it in the morning.  I guess this could be positional redistribution; also has been associated with nephrotic syndrome hence the above.  Blood pressure is currently well controlled.  I am not sure of the cause of her excessive and abrupt fatigue.  We discussed the potential  contribution of sleep apnea which she and her husband think unlikely, anxiety/depression, medications.  Notably her thyroid function was normal.  They have asked for me to help with FMLA forms.  Given the subjective nature of the findings, I have signed both forms stipulating that the claims are as they see  Current medicines are reviewed at length with the patient today .  The patient does not have concerns regarding medicines.  More than 50% of 50 min was spent in counseling related to the above

## 2019-04-20 NOTE — Patient Instructions (Addendum)
Medication Instructions:  - Your physician recommends that you continue on your current medications as directed. Please refer to the Current Medication list given to you today.  *If you need a refill on your cardiac medications before your next appointment, please call your pharmacy*   Lab Work: - Your physician recommends that you have lab work today: CMET/ D-Dimer/ Urinalysis  - please go upstairs to [employer], 1st desk on the right to check in for labs  If you have labs (blood work) drawn today and your tests are completely normal, you will receive your results only by: Marland Kitchen MyChart Message (if you have MyChart) OR . A paper copy in the mail If you have any lab test that is abnormal or we need to change your treatment, we will call you to review the results.   Testing/Procedures:  - Your cardiac CT will be scheduled at one of the below locations:   [facility] [address] [place], [place] [address] ([phone] [place] [address] [place], [place] [address] [phone]  If scheduled at [facility], please arrive at the [facility] main entrance of [facility] 30 minutes prior to test start time. Proceed to the [facility] Radiology Department (first floor) to check-in and test prep.  If scheduled at [facility], please arrive 15 mins early for check-in and test prep.  Please follow these instructions carefully (unless otherwise directed):  On the Night Before the Test: . Be sure to Drink plenty of water. . Do not consume any caffeinated/decaffeinated beverages or chocolate 12 hours prior to your test. . Do not take any antihistamines 12 hours prior to your test.  On the Day of the Test: . Drink plenty of water. Do not drink any water within one hour of the test. . Do not eat any food 4 hours prior to the test. . You may take your regular  medications prior to the test.  . Take metoprolol (Lopressor) 100 mg x 1 dose two hours prior to test. . FEMALES- please wear underwire-free bra if available       After the Test: . Drink plenty of water. . After receiving IV contrast, you may experience a mild flushed feeling. This is normal. . On occasion, you may experience a mild rash up to 24 hours after the test. This is not dangerous. If this occurs, you can take Benadryl 25 mg and increase your fluid intake. . If you experience trouble breathing, this can be serious. If it is severe call 911 IMMEDIATELY. If it is mild, please call our office. . If you take any of these medications: Glipizide/Metformin, Avandament, Glucavance, please do not take 48 hours after completing test unless otherwise instructed.   Once we have confirmed authorization from your insurance company, we will call you to set up a date and time for your test.   For non-scheduling related questions, please contact the cardiac imaging nurse navigator should you have any questions/concerns: Marchia Bond, RN Navigator Cardiac Imaging [facility] [phone] office  For scheduling needs, including cancellations and rescheduling, please call [phone].     Follow-Up: At [facility], you and your health needs are our priority.  As part of our continuing mission to provide you with exceptional heart care, we have created designated Provider Care Teams.  These Care Teams include your primary Cardiologist (physician) and Advanced Practice Providers (APPs -  Physician Assistants and Nurse Practitioners) who all work together to provide you with the care you need, when you need it.  We recommend signing up for the patient portal called "MyChart".  Sign up information is provided on this After Visit Summary.  MyChart is used to connect with patients for Virtual Visits (Telemedicine).  Patients are able to view lab/test results, encounter  notes, upcoming appointments, etc.  Non-urgent messages can be sent to your provider as well.   To learn more about what you can do with MyChart, go to [email or web address].    Your next appointment:   3 month(s)  The format for your next appointment:   In Person  Provider:   Virl Axe, MD   Other Instructions n/a

## 2019-04-22 ENCOUNTER — Other Ambulatory Visit
Admission: RE | Admit: 2019-04-22 | Discharge: 2019-04-22 | Disposition: A | Discharge status: Home / Self Care | Payer: Medicare HMO | Source: Ambulatory Visit | Attending: Internal Medicine | Admitting: Internal Medicine

## 2019-04-22 DIAGNOSIS — R072 Precordial pain: Secondary | ICD-10-CM | POA: Diagnosis present

## 2019-04-22 DIAGNOSIS — M7989 Other specified soft tissue disorders: Secondary | ICD-10-CM | POA: Diagnosis present

## 2019-04-22 LAB — COMPREHENSIVE METABOLIC PANEL
ALT: 27 U/L (ref 0–44)
AST: 21 U/L (ref 15–41)
Albumin: 3.9 g/dL (ref 3.5–5.0)
Alkaline Phosphatase: 56 U/L (ref 38–126)
Anion gap: 8 (ref 5–15)
BUN: 10 mg/dL (ref 6–20)
CO2: 27 mmol/L (ref 22–32)
Calcium: 8.8 mg/dL — ABNORMAL LOW (ref 8.9–10.3)
Chloride: 105 mmol/L (ref 98–111)
Creatinine, Ser: 0.62 mg/dL (ref 0.44–1.00)
GFR calc Af Amer: >60 mL/min (ref >60)
GFR calc non Af Amer: >60 mL/min (ref >60)
Glucose, Bld: 99 mg/dL (ref 70–99)
Potassium: 3.9 mmol/L (ref 3.5–5.1)
Sodium: 140 mmol/L (ref 135–145)
Total Bilirubin: 0.6 mg/dL (ref 0.3–1.2)
Total Protein: 6.9 g/dL (ref 6.5–8.1)

## 2019-04-22 LAB — URINALYSIS, ROUTINE W REFLEX MICROSCOPIC
Bacteria, UA: NONE SEEN
Bilirubin Urine: NEGATIVE
Glucose, UA: NEGATIVE mg/dL
Hgb urine dipstick: NEGATIVE
Ketones, ur: NEGATIVE mg/dL
Nitrite: NEGATIVE
Protein, ur: NEGATIVE mg/dL
Specific Gravity, Urine: 1.004 — ABNORMAL LOW (ref 1.005–1.030)
pH: 6 (ref 5.0–8.0)

## 2019-04-22 LAB — FIBRIN DERIVATIVES D-DIMER (ARMC ONLY): Fibrin derivatives D-dimer (ARMC): 274.11 ng/mL (FEU) (ref 0.00–499.00)

## 2019-04-28 ENCOUNTER — Telehealth: Payer: Self-pay

## 2019-04-28 NOTE — Telephone Encounter (Signed)
Call to patient to review labs.    Pt verbalized understanding and has no further questions at this time.    Advised pt to call for any further questions or concerns.  No further orders.   

## 2019-04-28 NOTE — Telephone Encounter (Signed)
-----   Message from Deboraha Sprang, MD sent at 04/27/2019  9:30 PM EDT ----- Please Inform Patient  Labs are normal x #minimally low Ca prob not an issue but can  followup wi PCP   Thanks

## 2019-05-09 ENCOUNTER — Telehealth (HOSPITAL_COMMUNITY): Payer: Self-pay | Admitting: Emergency Medicine

## 2019-05-09 NOTE — Telephone Encounter (Signed)
Pt calling with concerns of taking a large dose of metoprolol prior to CTA appt on 05/11/19. States she was taken off metoprolol d/t bradycardia. Her resting HR is sometimes in 40s. I encouraged her to take BP and HR thurs morning, 2 hr prior to test as she would if she were to take the medication and report her results to me via my mobile number. I would then given feedback and suggest whether she take med or not.   I explained that we do have protocols where we can give IV doses in smaller increments however this can be time consuming.   Pt appreciated the phone call and advice Marchia Bond RN Navigator Cardiac Imaging [facility] [phone] Office  [phone] Cell

## 2019-05-11 ENCOUNTER — Other Ambulatory Visit: Payer: Self-pay

## 2019-05-11 ENCOUNTER — Ambulatory Visit
Admission: RE | Admit: 2019-05-11 | Discharge: 2019-05-11 | Disposition: A | Discharge status: Home / Self Care | Payer: Medicare HMO | Source: Ambulatory Visit | Attending: Internal Medicine | Admitting: Internal Medicine

## 2019-05-11 ENCOUNTER — Ambulatory Visit: Payer: Medicare Other | Admitting: Internal Medicine

## 2019-05-11 DIAGNOSIS — R072 Precordial pain: Secondary | ICD-10-CM | POA: Insufficient documentation

## 2019-05-11 MED ORDER — METOPROLOL TARTRATE 5 MG/5ML IV SOLN
10.0000 mg | INTRAVENOUS | Status: DC | PRN
  Administered 2019-05-11 (×2): 10 mg via INTRAVENOUS

## 2019-05-11 MED ORDER — NITROGLYCERIN 0.4 MG SL SUBL: 0.8000 mg | SUBLINGUAL_TABLET | Freq: Once | SUBLINGUAL | Status: DC

## 2019-05-11 MED ORDER — IOHEXOL 350 MG/ML SOLN
100.0000 mL | Freq: Once | INTRAVENOUS | Status: AC | PRN
  Administered 2019-05-11: 100 mL via INTRAVENOUS

## 2019-05-11 NOTE — Progress Notes (Signed)
Patient nervous about CT scan and taking medication. Heart rate not able to proceed with CT due to being high. Doctor notified ordered different medication. Patient notified and due to her history "heart stopping" would rather schedule and take medication prior to arriving for CT.

## 2019-05-30 ENCOUNTER — Telehealth (HOSPITAL_COMMUNITY): Payer: Self-pay | Admitting: Emergency Medicine

## 2019-05-30 NOTE — Telephone Encounter (Signed)
Pt called and left VM message with questions about medications to take/not take prior to CTA appt on 5/3.   Attempted to call patient regarding upcoming cardiac CT appointment. Left message on voicemail with name and callback number Marchia Bond RN Navigator Cardiac [facility] [phone] Office [phone] Cell

## 2019-05-31 ENCOUNTER — Telehealth (HOSPITAL_COMMUNITY): Payer: Self-pay | Admitting: Emergency Medicine

## 2019-05-31 NOTE — Telephone Encounter (Signed)
thanks

## 2019-05-31 NOTE — Telephone Encounter (Signed)
Pt calling with concerns of taking newly prescribed augmentin for UTI and if it will affect upcoming cardiac CTA.   I confirmed that it would not affect the test. But patient also reiterated how nervous she is about the test and controlling her HR. States her resting HR at home is in the 40-50s however our last attempt to scan her in [place] we were unable to control the HR related to her anxiety. She states when she left the outpatient imaging center she automatically felt better but then got lightheaded and felt her HR slow.   I encouraged her not to take the PO metoprolol as her resting HR is low. That we would administer IV beta blockers PRN for HR control for newest CTA attempt.  Pt I encouraged patient to take a dose of her prescribed anti anxiety medication to help control HR for test since shes convinced that was the reason we were not able to control her HR last attempt.   Marchia Bond RN Navigator Cardiac Imaging [facility] [phone] Office  [phone] Cell

## 2019-06-05 ENCOUNTER — Other Ambulatory Visit: Payer: Self-pay

## 2019-06-05 ENCOUNTER — Other Ambulatory Visit: Payer: Self-pay | Admitting: Internal Medicine

## 2019-06-05 ENCOUNTER — Ambulatory Visit (HOSPITAL_COMMUNITY)
Admission: RE | Admit: 2019-06-05 | Discharge: 2019-06-05 | Disposition: A | Discharge status: Home / Self Care | Payer: Medicare HMO | Source: Ambulatory Visit | Attending: Internal Medicine | Admitting: Internal Medicine

## 2019-06-05 DIAGNOSIS — R072 Precordial pain: Secondary | ICD-10-CM | POA: Diagnosis not present

## 2019-06-05 NOTE — Progress Notes (Signed)
Pt states " she did not take the premedication due to past history of bradycardic events with accompanied arrest" Patient is notably anxious at this time. She mentions she took tylenol and anti-anxiety medication this AM without relief. Will notify Dr. Radford Pax of current status

## 2019-06-05 NOTE — Progress Notes (Signed)
Spoke with Dr. Radford Pax about patient condition. Per Dr. Radford Pax the patient will receive a calcium score only at this time. CT notified of changes.

## 2019-07-27 ENCOUNTER — Ambulatory Visit: Payer: Medicare HMO | Admitting: Internal Medicine

## 2019-07-28 ENCOUNTER — Encounter: Payer: Self-pay | Admitting: Internal Medicine

## 2019-10-03 ENCOUNTER — Encounter: Payer: Self-pay | Admitting: *Deleted

## 2019-10-03 ENCOUNTER — Ambulatory Visit: Payer: Medicare HMO | Admitting: Internal Medicine

## 2019-10-03 ENCOUNTER — Encounter: Payer: Self-pay | Admitting: Internal Medicine

## 2019-10-03 ENCOUNTER — Other Ambulatory Visit: Payer: Self-pay

## 2019-10-03 VITALS — BP 122/85 | HR 83 | Ht 64.5 in | Wt 186.1 lb

## 2019-10-03 DIAGNOSIS — I951 Orthostatic hypotension: Secondary | ICD-10-CM

## 2019-10-03 DIAGNOSIS — G901 Familial dysautonomia [Riley-Day]: Secondary | ICD-10-CM

## 2019-10-03 DIAGNOSIS — I491 Atrial premature depolarization: Secondary | ICD-10-CM

## 2019-10-03 DIAGNOSIS — I10 Essential (primary) hypertension: Secondary | ICD-10-CM

## 2019-10-03 DIAGNOSIS — I493 Ventricular premature depolarization: Secondary | ICD-10-CM | POA: Diagnosis not present

## 2019-10-03 NOTE — Progress Notes (Addendum)
Patient Care Team: Denton Lank, MD as PCP - General (Family Medicine)   HPI  Ariel Bryant is a 49 y.o. female Seen in follow-up for symptoms consistent with dysautonomia. She's had significant GI symptoms and she was referred to [facility].  At the last visit we encouraged exercise salt supplementation and consideration of addressing the psychosocial aspects related to her PTSD   GI symptoms diarrhea nausea vomiting and decreased p.o. intake and had syncope.  While being hooked up to the electrocardiogram she had sinus slowing and sinus arrest.  This occurred on a second occasion.  Saw GI @ [facility] who felt GI symptoms were secondary to dysautonomia  When seen by telehealth 1/21 she reported doing significantly better.  Notable for orthostasis and less nausea with standing.  Still with significant palpitations.  She has not tolerated daily flecainide-- was treated with propafenone  More recently she has had problems again with lightheadedness.  No syncope although she has had presyncope.  Typically with a prodrome of diaphoresis.  No changes in her fluid intake.  She is prone to being salt averse because #1 she does not like it and   2- prone to swelling  Wearing tight leggings  Not exercising.  Denies depression.   Date Cr K Mg Hgb TSH  3/19  3.1     4/19 0.84 4.2 2.3 13.4 0.584  11/20  0.72 3.5   1.5  3/21 0.62 3.9   1.5    DATE TEST EF   3/19 Echo   60-65 %   5/21 CaScore   % Score = 0           Complaints of chest pain prompted calcium scoring 5/21>>0  Records and Results Reviewed. As above   Past Medical History:  Diagnosis Date  . Anxiety   . GERD (gastroesophageal reflux disease)   . PTSD (post-traumatic stress disorder)   . Sinus tachycardia     Past Surgical History:  Procedure Laterality Date  . BREAST BIOPSY Left 12/28/2016   Affirm Biopsy- Fibrocystic changes. RIbbon clip  . CESAREAN SECTION     NSD x 3 followed by C/S for 8 + pounds breech    . CHOLECYSTECTOMY    . LAPAROSCOPIC OOPHERECTOMY    . VAGINAL HYSTERECTOMY     had BTL, then VH, later developed ovarian mass and had ooph at 49 yo    Current Outpatient Medications  Medication Sig Dispense Refill  . acetaminophen (TYLENOL) 325 MG tablet Take 650 mg by mouth as needed for mild pain, moderate pain, fever or headache.     Marland Kitchen acyclovir (ZOVIRAX) 400 MG tablet Take 400 mg by mouth as needed (AS NEEDED FOR COLD SORES).     . Ascorbic Acid (VITAMIN C) 1000 MG tablet Take 1,000 mg by mouth daily.    Marland Kitchen aspirin EC 81 MG tablet Take 81 mg by mouth as needed. Swallow whole.    . Biotin 1 MG CAPS Take 1 capsule by mouth daily.    . Calcium Carb-Cholecalciferol (CALCIUM + D3) 600-200 MG-UNIT TABS Take 1 tablet by mouth daily at 2 am.     . cholecalciferol (VITAMIN D3) 25 MCG (1000 UT) tablet Take 1,000 Units by mouth daily.    . clonazePAM (KLONOPIN) 2 MG tablet 2 (two) times daily as needed.    . Coenzyme Q10 (CO Q-10 PO) Take by mouth daily.    Marland Kitchen dicyclomine (BENTYL) 10 MG capsule Take 10 mg by mouth as  needed.    . fluticasone (FLONASE) 50 MCG/ACT nasal spray Place 1 spray into both nostrils as needed. For Eustachian Tube Dysfunction    . Garlic (GARLIQUE) 595 MG TBEC Take by mouth as needed.     . magnesium oxide (MAG-OX) 400 MG tablet Takes occassionally    . montelukast (SINGULAIR) 10 MG tablet TAKE 1 TABLET BY MOUTH DAILY FOR ASTHMA AND ALLERGIES    . niacin (SLO-NIACIN) 500 MG tablet Take 500 mg by mouth daily.     . NON FORMULARY GOLI 1 gummy twice daily.    . Omega-3 1000 MG CAPS Fish Oil 300 mg-1,000 mg capsule  TAKE ONE CAPSULE TWICE DAILY.    Marland Kitchen omeprazole (PRILOSEC) 40 MG capsule Take 40 mg by mouth daily.    . potassium gluconate 595 (99 K) MG TABS tablet Take 595 mg by mouth daily.     . vitamin B-12 (CYANOCOBALAMIN) 1000 MCG tablet Take 1,000 mcg by mouth as needed.     . zinc gluconate 50 MG tablet Take 50 mg by mouth daily.    . fluticasone (FLOVENT HFA) 110  MCG/ACT inhaler Inhale into the lungs 2 (two) times daily.    Marland Kitchen LORAZEPAM PO Take 2 mg by mouth daily as needed.     . magnesium oxide (MAG-OX) 400 MG tablet TAKE 1 TABLET BY MOUTH EVERY DAY 90 tablet 0  . metoprolol tartrate (LOPRESSOR) 100 MG tablet Take 1 tablet (100 mg) by mouth 2 hours prior to your cardiac CT x 1 dose 1 tablet 0  . NON FORMULARY [place] Beets 1 twice daily.    . Turmeric, Curcuma Longa, (TURMERIC ROOT) POWD Take by mouth as needed.      No current facility-administered medications for this visit.    Allergies  Allergen Reactions  . Codeine Shortness Of Breath  . Dilaudid  [Hydromorphone Hcl] Tinitus  . Sulfa Antibiotics Anaphylaxis and Other (See Comments)  . Azithromycin Other (See Comments)  . Ciprofloxacin Other (See Comments)  . Doxycycline Monohydrate Other (See Comments)  . Doxycycline Rash      Review of Systems negative except from HPI and PMH  Physical Exam BP 122/85 (BP Location: Left Arm, Patient Position: Sitting, Cuff Size: Normal)   Pulse 83   Ht 5' 4.5" (1.638 m)   Wt 186 lb 2 oz (84.4 kg)   SpO2 95%   BMI 31.45 kg/m   Well developed and nourished in no acute distress HENT normal Neck supple with JVP-  flat   Clear Regular rate and rhythm, no murmurs or gallops Abd-soft with active BS No Clubbing cyanosis edema Skin-warm and dry A & Oriented  Grossly normal sensory and motor function  ECG sinus at 83 Intervals 14/07/38 Axis 65    Assessment and  Plan  Dysautonomia  PVCs/PACs  Palpitations  Monitor>> PACs but no PVCs  Psychosocial stress/PTSD   Sinus Arrest and syncope  Orthostatic Hypotension  Hypertension  Chest pain   Swelling  Calcium score reassuring in regards to her chest pain.  Palpitations are quiescient  Struggling again with dizziness which seems to come in waves.  Encouraged the use of compression wear.  Encouraged exercise.  Has not noticed an association between blood pressure and  symptoms.  Orthostasis in the past makes me wary of using beta-blockers for her tachypalpitations.  While her Fitbit says her heart rate is going to 170, her apple watch has only recorded sinus rates in the low 100s.  Associated diuresis may be a  manifestation of AV nodal reentry.  Encouraged her to use her apple watch to clarify.  Lengthy discussion regarding thevaccine,data offered and encouraged

## 2019-10-03 NOTE — Patient Instructions (Signed)
Medication Instructions:  - Your physician recommends that you continue on your current medications as directed. Please refer to the Current Medication list given to you today.  *If you need a refill on your cardiac medications before your next appointment, please call your pharmacy*   Lab Work: - none ordered  If you have labs (blood work) drawn today and your tests are completely normal, you will receive your results only by: Marland Kitchen MyChart Message (if you have MyChart) OR . A paper copy in the mail If you have any lab test that is abnormal or we need to change your treatment, we will call you to review the results.   Testing/Procedures: - none ordered   Follow-Up: At [facility], you and your health needs are our priority.  As part of our continuing mission to provide you with exceptional heart care, we have created designated Provider Care Teams.  These Care Teams include your primary Cardiologist (physician) and Advanced Practice Providers (APPs -  Physician Assistants and Nurse Practitioners) who all work together to provide you with the care you need, when you need it.  We recommend signing up for the patient portal called "MyChart".  Sign up information is provided on this After Visit Summary.  MyChart is used to connect with patients for Virtual Visits (Telemedicine).  Patients are able to view lab/test results, encounter notes, upcoming appointments, etc.  Non-urgent messages can be sent to your provider as well.   To learn more about what you can do with MyChart, go to [email or web address].    Your next appointment:   4 month(s)  The format for your next appointment:   In Person  Provider:   Virl Axe, MD   Other Instructions 1) Compression during your waking hours 2) Exercise

## 2019-10-03 NOTE — Addendum Note (Signed)
Addended by: Deboraha Sprang on: 10/03/2019 12:08 PM   Modules accepted: Level of Service

## 2019-10-25 ENCOUNTER — Other Ambulatory Visit: Payer: Self-pay | Admitting: Internal Medicine

## 2019-11-03 IMAGING — MG MM CLIP PLACEMENT
6 series · 6 of 14 positions shown · non-contrast
Comparison: Previous exam(s).

ADDENDUM:
PATHOLOGY: Fibrocystic change in the lateral left breast. Negative
for atypia and malignancy.

CONCORDANT: No.
RECOMMENDATION: Surgical consultation is recommended. The nurse
navigators will contact the patient with the referral appointment.
CLINICAL DATA: Post stereotactic core needle biopsy of left breast
distortion.
EXAM:
DIAGNOSTIC LEFT MAMMOGRAM POST STEREOTACTIC BIOPSY

[L CC synth-2D]
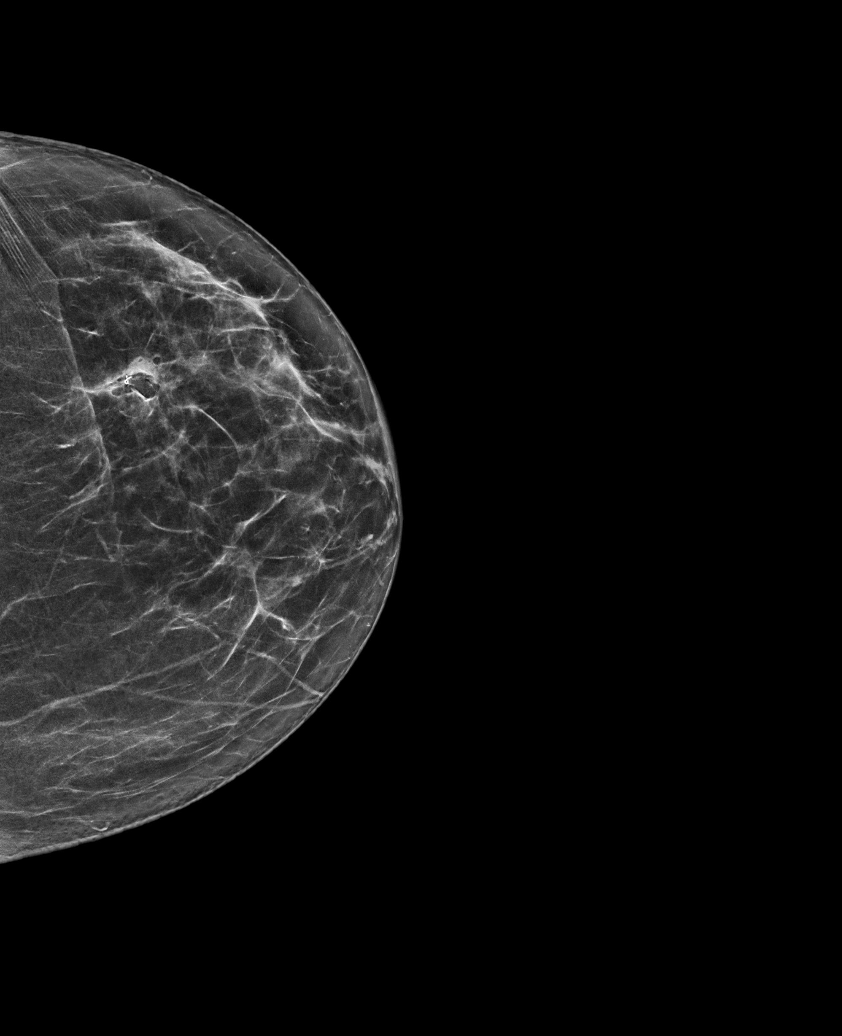

[L ML synth-2D]
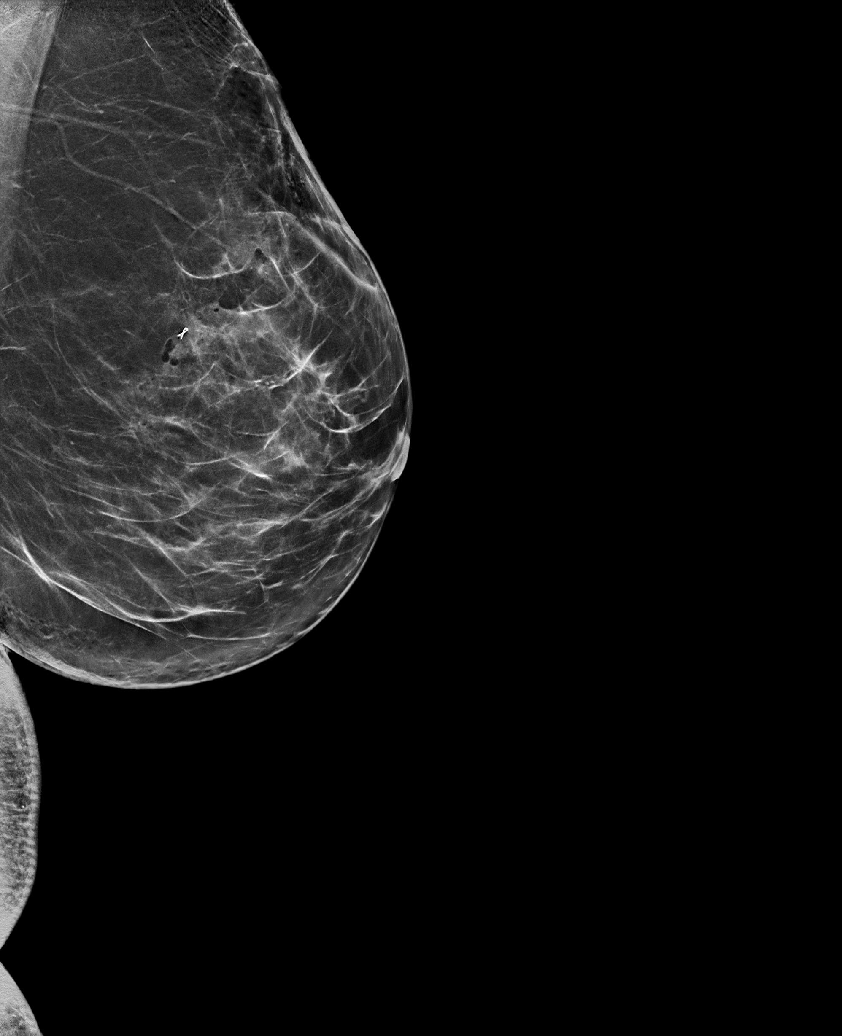

[L ML]
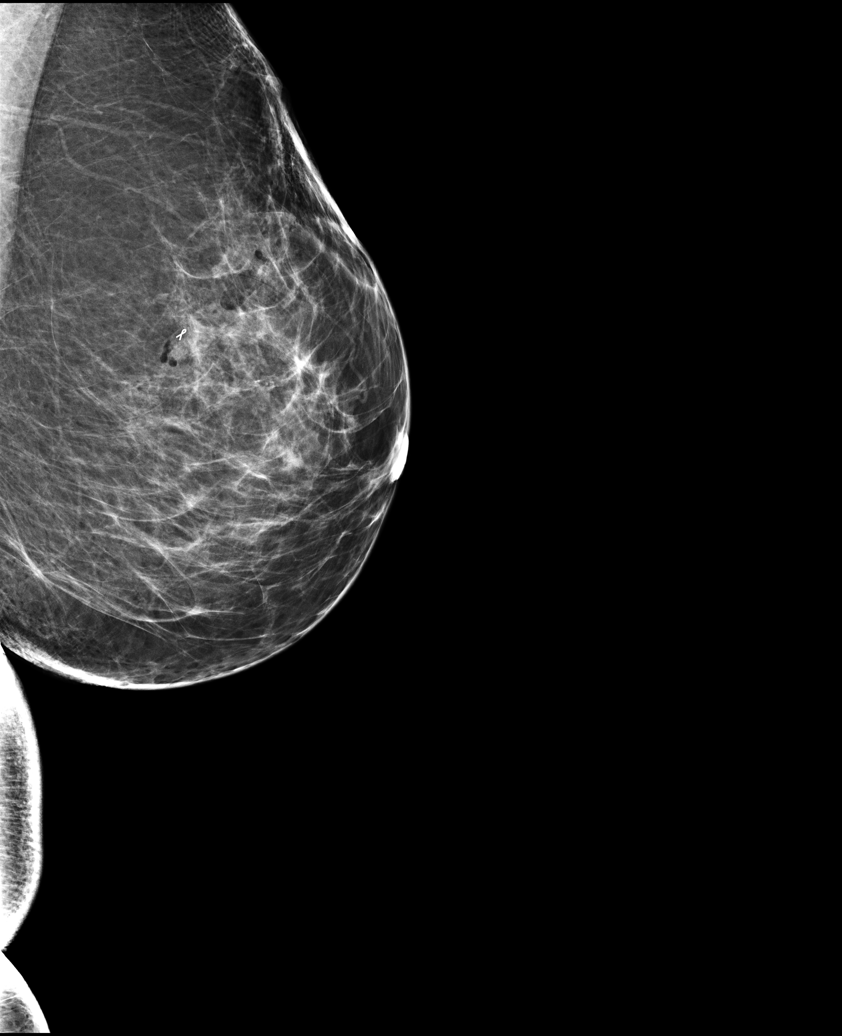

[L CC]
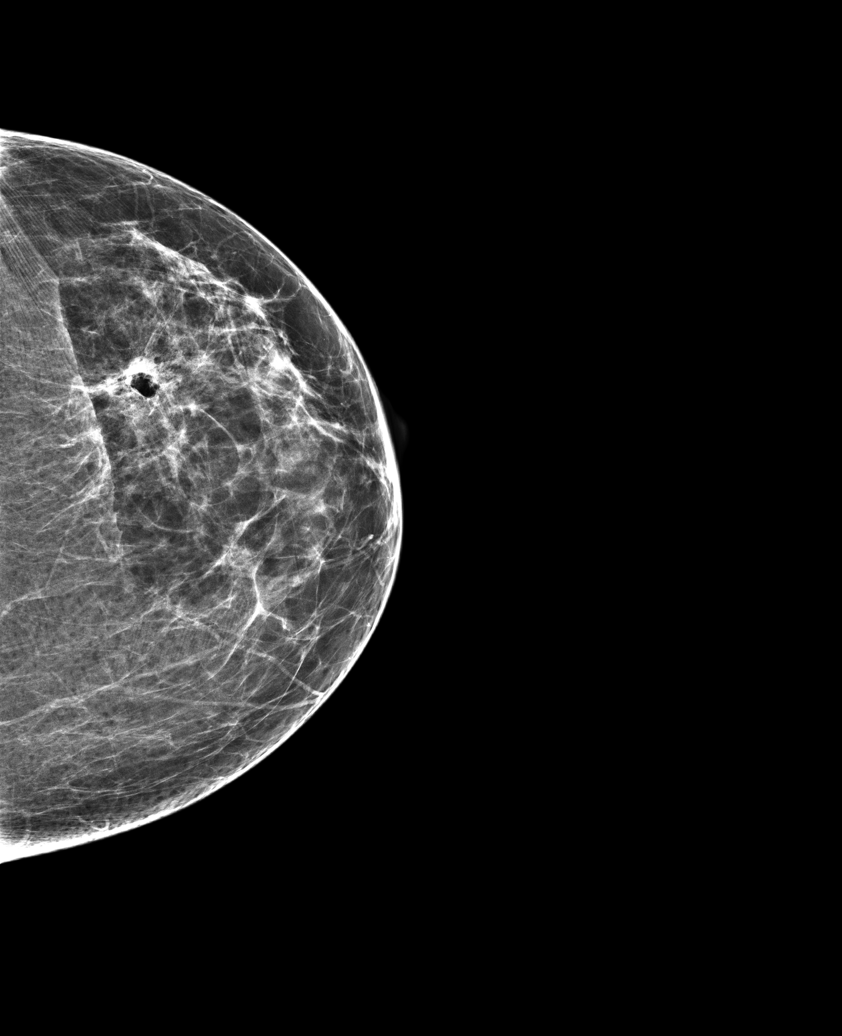

[L ML tomo · tomo slice 39/76.0]
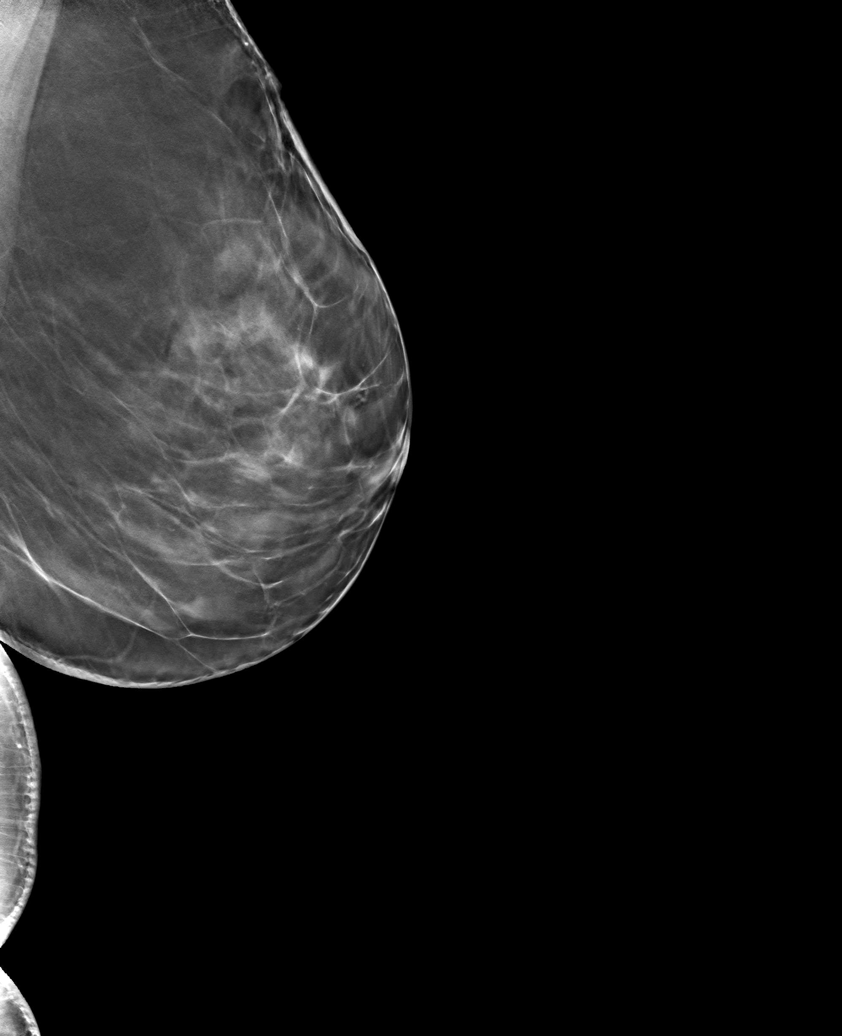

[L CC tomo · tomo slice 35/69.0]
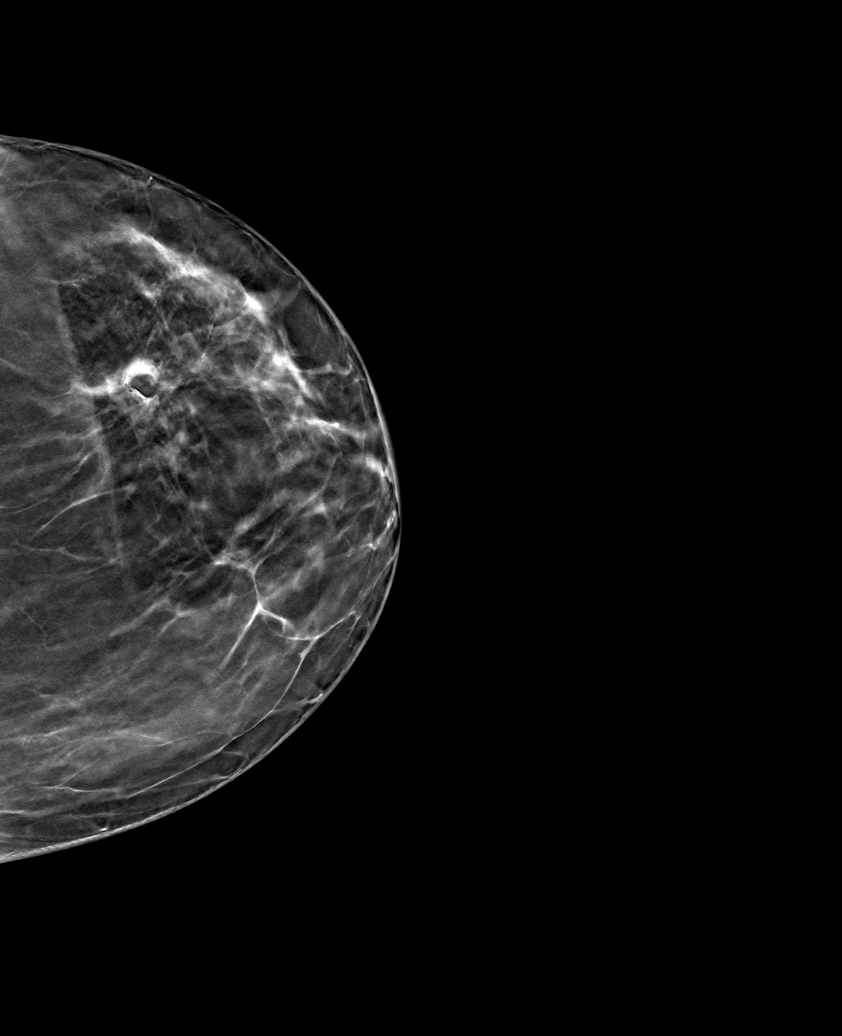

[6 of 14 positions shown; findings below may reference images not displayed]

FINDINGS: Mammographic images were obtained following stereotactic guided
biopsy of left lateral breast distortion. Two-view mammography
demonstrates presence of ribbon shaped marker within the previously
demonstrated left lateral breast distortion, which is located in the
upper outer quadrant. Expected post biopsy changes are seen.
IMPRESSION: Successful placement of ribbon shaped marker, post stereotactic core
needle biopsy of the left breast distortion.

Final Assessment: Post Procedure Mammograms for Marker Placement

## 2019-11-03 IMAGING — MG STEREOTACTIC CORE NEEDLE BIOPSY
9 series · 9 of 21 positions shown · non-contrast
Comparison: Previous exams.

CLINICAL DATA: Left lateral breast distortion.

EXAM:
LEFT BREAST STEREOTACTIC CORE NEEDLE BIOPSY

[L (1 of 6)]
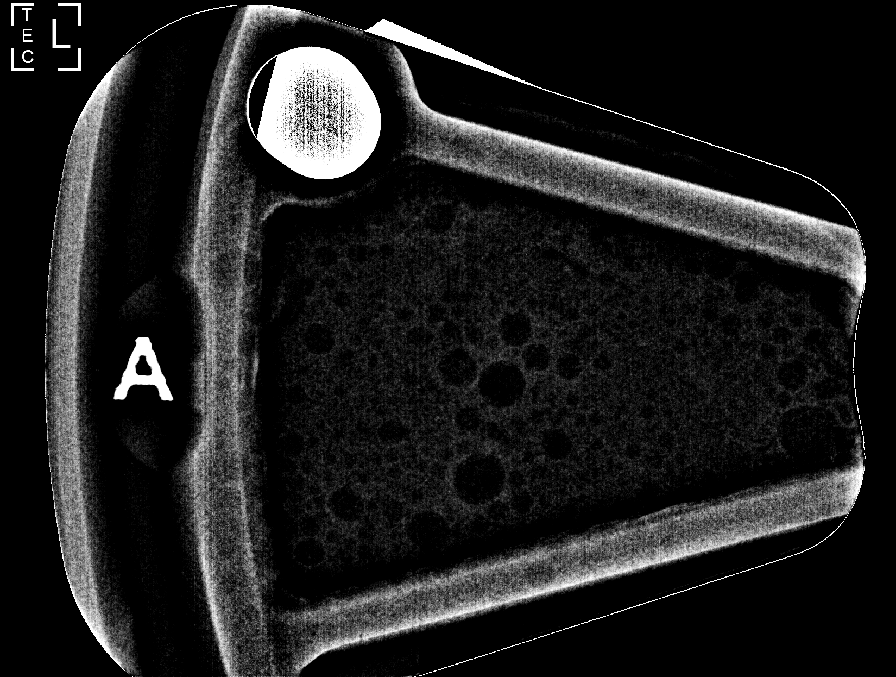

[L (2 of 6)]
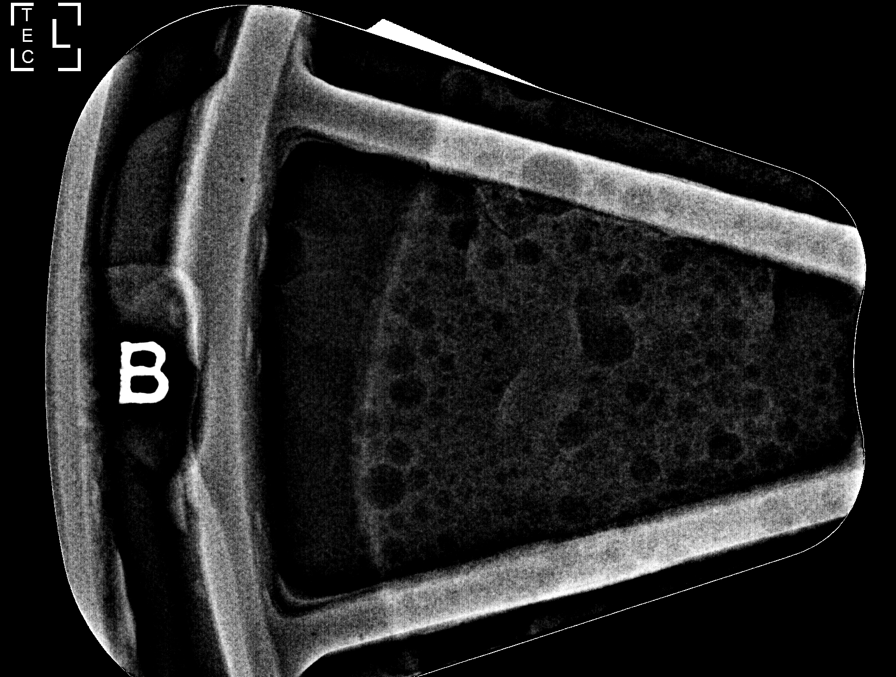

[L (3 of 6)]
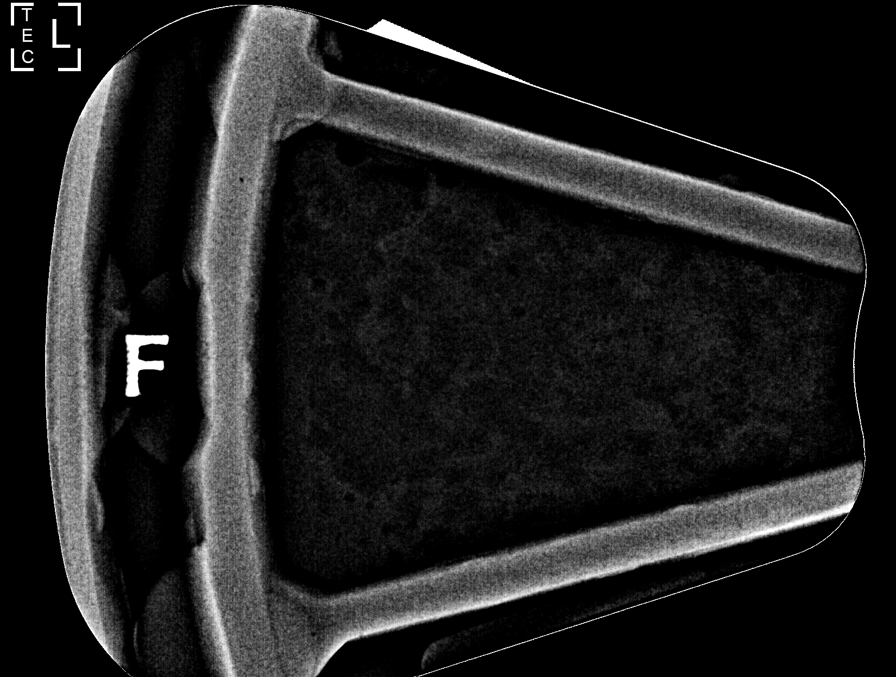

[L (4 of 6)]
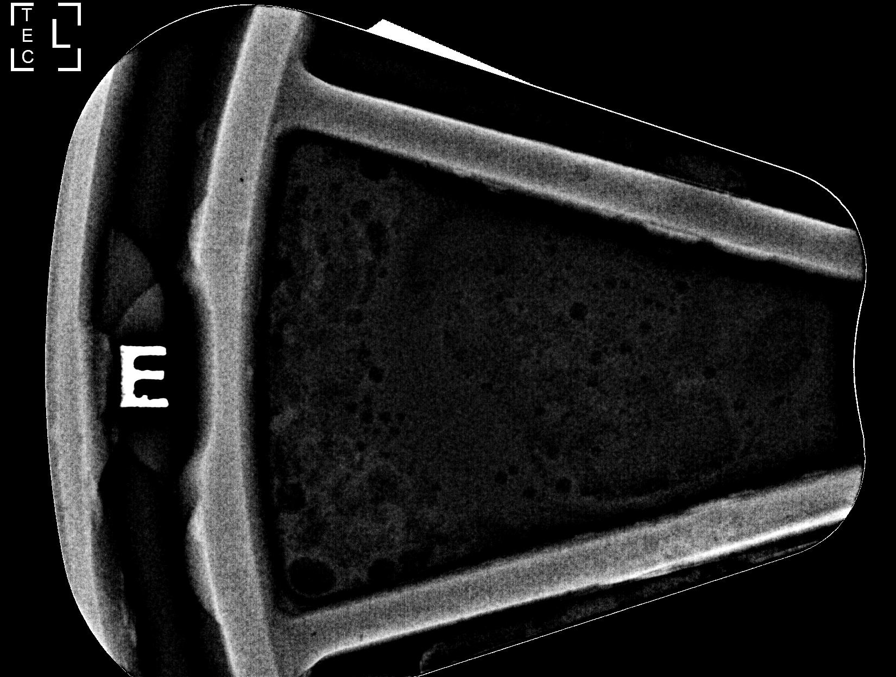

[L (5 of 6)]
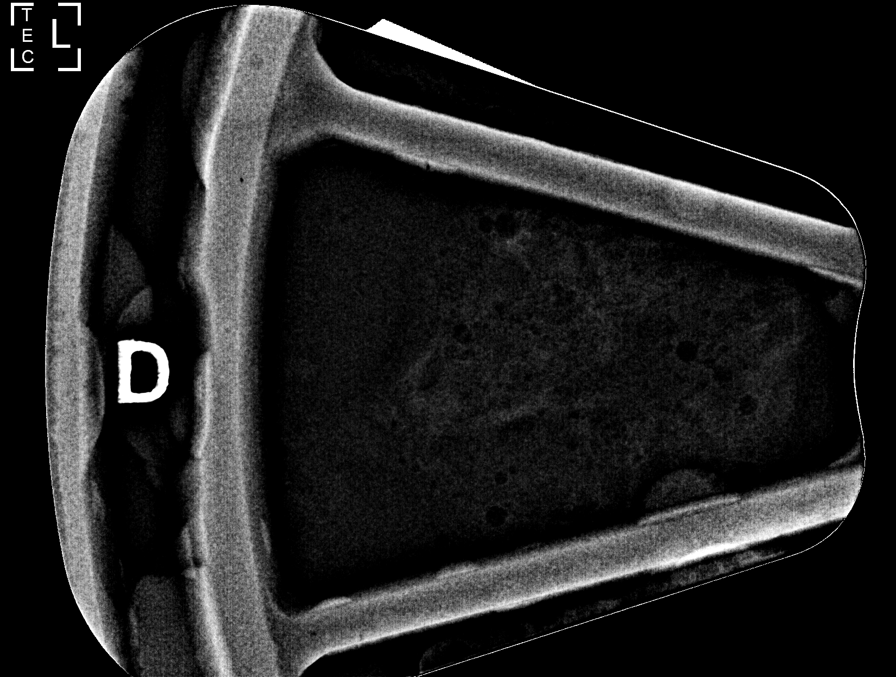

[L (6 of 6)]
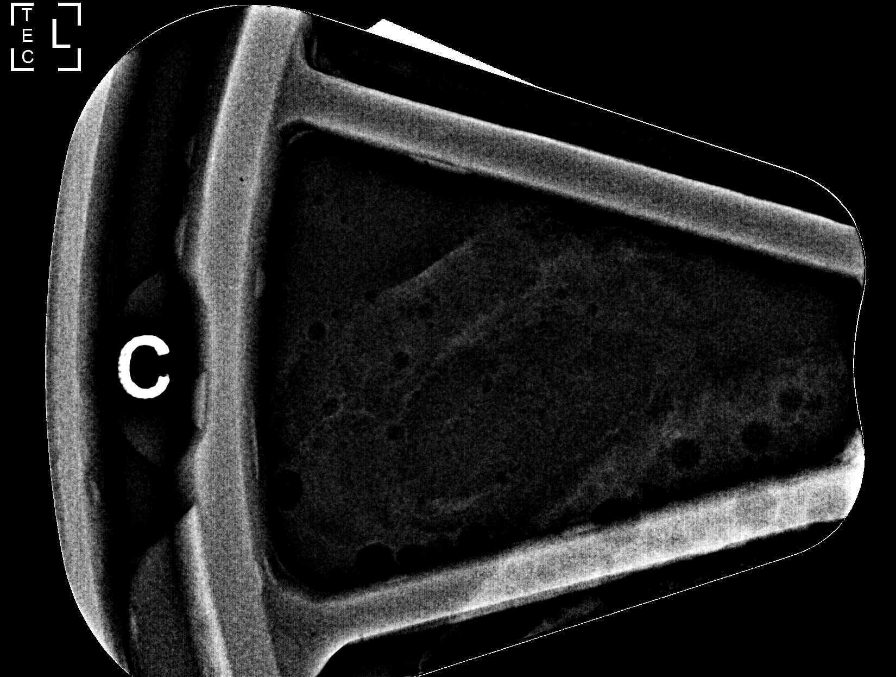

[L CC tomo (1 of 3) · tomo slice 31/62.0]
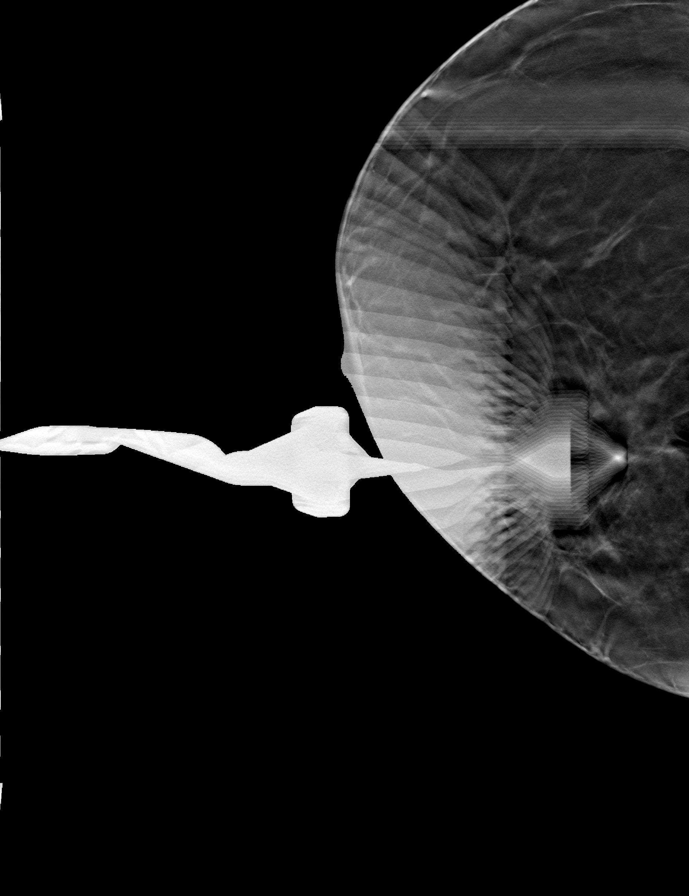

[L CC tomo (2 of 3) · tomo slice 31/62.0]
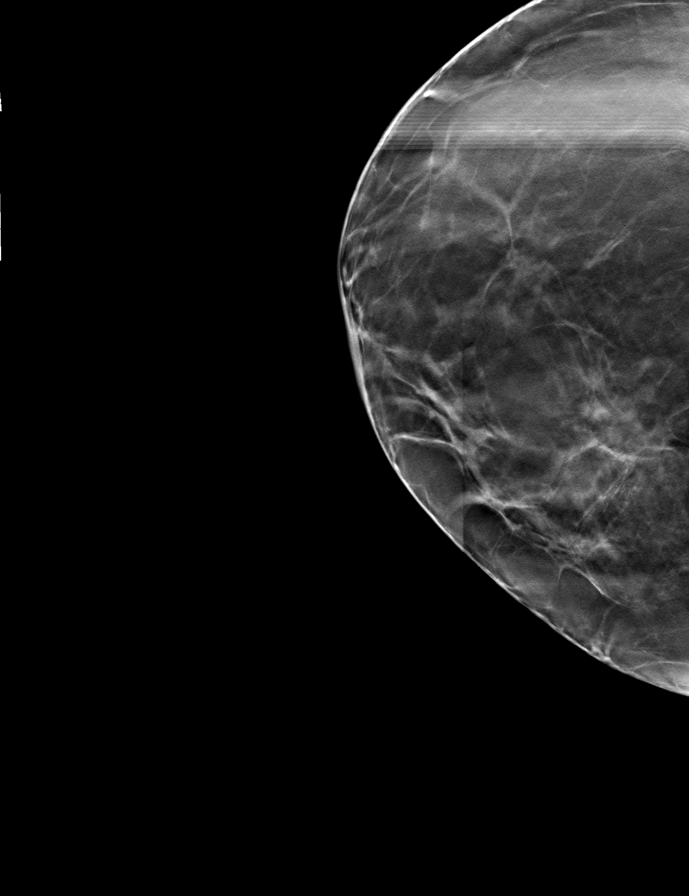

[L CC tomo (3 of 3) · tomo slice 32/63.0]
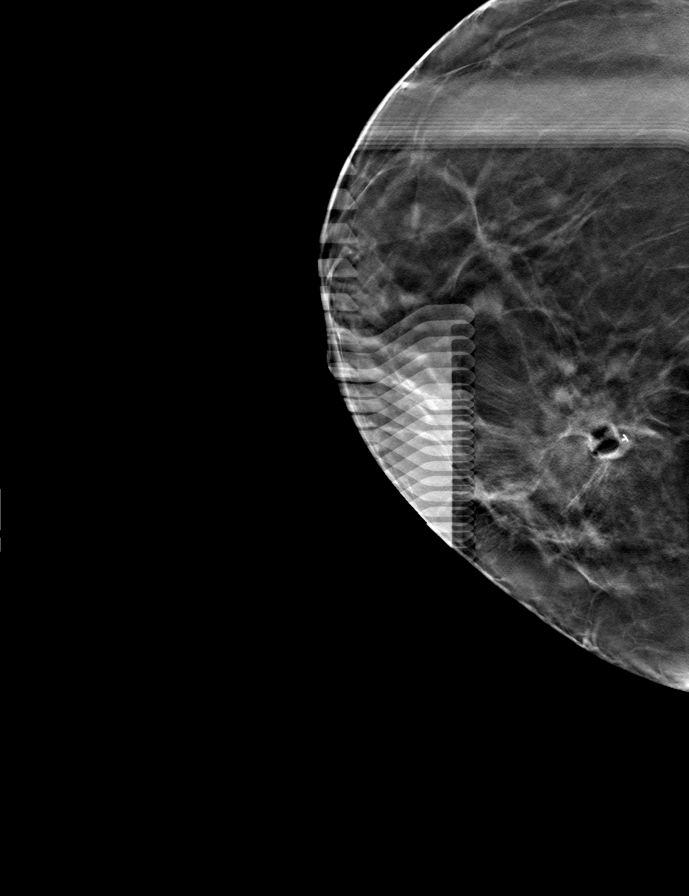

[9 of 21 positions shown; findings below may reference images not displayed]



Using sterile technique and 1% Lidocaine as local anesthetic, under
stereotactic guidance, a 9 gauge vacuum assisted device was used to
perform core needle biopsy of distortion in the left lateral breast
using a superior approach. Specimen radiograph was performed showing
presence of tissue. Specimens with calcifications are identified for
pathology.

Lesion quadrant: Upper outer quadrant

At the conclusion of the procedure, a ribbon shaped tissue marker
clip was deployed into the biopsy cavity. Follow-up 2-view mammogram
was performed and dictated separately.
IMPRESSION: Stereotactic-guided biopsy of left breast. No apparent
complications.

## 2019-11-28 ENCOUNTER — Other Ambulatory Visit: Payer: Self-pay | Admitting: Family Medicine

## 2019-11-28 DIAGNOSIS — N6019 Diffuse cystic mastopathy of unspecified breast: Secondary | ICD-10-CM

## 2019-11-28 DIAGNOSIS — R928 Other abnormal and inconclusive findings on diagnostic imaging of breast: Secondary | ICD-10-CM

## 2019-12-04 ENCOUNTER — Ambulatory Visit
Admission: RE | Admit: 2019-12-04 | Discharge: 2019-12-04 | Disposition: A | Discharge status: Home / Self Care | Payer: Medicare HMO | Source: Ambulatory Visit | Attending: Family Medicine | Admitting: Family Medicine

## 2019-12-04 ENCOUNTER — Other Ambulatory Visit: Payer: Self-pay

## 2019-12-04 DIAGNOSIS — R928 Other abnormal and inconclusive findings on diagnostic imaging of breast: Secondary | ICD-10-CM | POA: Diagnosis present

## 2019-12-04 DIAGNOSIS — N6019 Diffuse cystic mastopathy of unspecified breast: Secondary | ICD-10-CM

## 2019-12-12 ENCOUNTER — Other Ambulatory Visit: Payer: Self-pay | Admitting: Internal Medicine

## 2019-12-12 NOTE — Telephone Encounter (Signed)
Please advise if OK to refill. Patient takes "occassionally". Pharmacy requesting 1 tablet PO QD. Listed under Historical provider on med list. Med not discussed in Dr. Olin Pia last office note. Thank you!

## 2019-12-12 NOTE — Telephone Encounter (Signed)
Would defer to pcp

## 2019-12-18 ENCOUNTER — Other Ambulatory Visit: Payer: Medicare HMO

## 2019-12-22 ENCOUNTER — Telehealth: Payer: Self-pay | Admitting: Cardiology

## 2019-12-22 NOTE — Telephone Encounter (Signed)
Ariel Bryant is a 49 yo F w/ hx of orthostatic hypotension and presyncope. Calling regarding use of Losartan. Her BP at home has been ranging around 536/46OEHO systolics. Due to hx of presyncope and syncope and orthostatics and relative normal BP. I have told patient to hold her Losartan and to call us back if her BP increases further over the course of next few days. Encouraged patient to keep a BP log.   Patient understood and is in agreement with the plan.

## 2020-01-01 ENCOUNTER — Telehealth: Payer: Self-pay | Admitting: Internal Medicine

## 2020-01-01 NOTE — Telephone Encounter (Signed)
Patient was prescribed prednisone by [facility]. Patient wanting to see if she could get a low dose of metoprolol called in to keep her HR in line while on the other mediations   Please advise as patient is waiting to start medication until she hears back

## 2020-01-01 NOTE — Telephone Encounter (Signed)
I spoke with the patient.  She states she went to Urgent Care today for symptoms of fever/ congestion. She was diagnosed with an URI & possible bronchitis.  She advised she was swabbed for flu & COVID and these test were negative.   She was prescribed Augmentin and Prednisone 5 mg once daily x 5 days.  She called today to request a PRN beta blocker RX in case of elevated HR's from the prednisone.  She is currently taking and already prescribed ventolin RX for wheezing.   She carries a diagnosis of orthostatic hypotension & dysautonomia.   BP recently at home was 128/98. Today she was 114/86 at Urgent Care.   I advised her I will need to review with Dr. Caryl Comes & call her back, but it may be tomorrow before I have an answer for her. She inquired if she should go ahead a start her prednisone. I have advised her to start this as prescribed.  She states recent HR's have been intermittently over 100 when she has been febrile.  To Dr. Caryl Comes to review.

## 2020-01-02 ENCOUNTER — Telehealth: Payer: Self-pay | Admitting: Internal Medicine

## 2020-01-02 MED ORDER — PROPRANOLOL HCL 10 MG PO TABS: ORAL_TABLET | ORAL | 0 refills | Status: DC

## 2020-01-02 NOTE — Telephone Encounter (Signed)
Brief literature review suggests that sinus bradycardia as opposed to tachycardia is much more likely, indeed sinus tachycardia did not come up at all in in the brief search.  Hence, would be a little bit reluctant to think about taking a beta-blocker because of the prednisone, however, we will prescribe Inderal 10 mg to take as needed for tachypalpitations whether with or without the prednisone Thanks SK

## 2020-01-02 NOTE — Telephone Encounter (Signed)
°  Patient Consent for Virtual Visit         Ariel Bryant has provided verbal consent on 01/02/2020 for a virtual visit (video or telephone).   CONSENT FOR VIRTUAL VISIT FOR:  Ariel Bryant  By participating in this virtual visit I agree to the following:  I hereby voluntarily request, consent and authorize [facility] and its employed or contracted physicians, physician assistants, nurse practitioners or other licensed health care professionals (the Practitioner), to provide me with telemedicine health care services (the Services") as deemed necessary by the treating Practitioner. I acknowledge and consent to receive the Services by the Practitioner via telemedicine. I understand that the telemedicine visit will involve communicating with the Practitioner through live audiovisual communication technology and the disclosure of certain medical information by electronic transmission. I acknowledge that I have been given the opportunity to request an in-person assessment or other available alternative prior to the telemedicine visit and am voluntarily participating in the telemedicine visit.  I understand that I have the right to withhold or withdraw my consent to the use of telemedicine in the course of my care at any time, without affecting my right to future care or treatment, and that the Practitioner or I may terminate the telemedicine visit at any time. I understand that I have the right to inspect all information obtained and/or recorded in the course of the telemedicine visit and may receive copies of available information for a reasonable fee.  I understand that some of the potential risks of receiving the Services via telemedicine include:   Delay or interruption in medical evaluation due to technological equipment failure or disruption;  Information transmitted may not be sufficient (e.g. poor resolution of images) to allow for appropriate medical decision making by the  Practitioner; and/or   In rare instances, security protocols could fail, causing a breach of personal health information.  Furthermore, I acknowledge that it is my responsibility to provide information about my medical history, conditions and care that is complete and accurate to the best of my ability. I acknowledge that Practitioner's advice, recommendations, and/or decision may be based on factors not within their control, such as incomplete or inaccurate data provided by me or distortions of diagnostic images or specimens that may result from electronic transmissions. I understand that the practice of medicine is not an exact science and that Practitioner makes no warranties or guarantees regarding treatment outcomes. I acknowledge that a copy of this consent can be made available to me via my patient portal ([facility]), or I can request a printed copy by calling the office of [facility].    I understand that my insurance will be billed for this visit.   I have read or had this consent read to me.  I understand the contents of this consent, which adequately explains the benefits and risks of the Services being provided via telemedicine.   I have been provided ample opportunity to ask questions regarding this consent and the Services and have had my questions answered to my satisfaction.  I give my informed consent for the services to be provided through the use of telemedicine in my medical care

## 2020-01-02 NOTE — Telephone Encounter (Signed)
I called and spoke with the patient regarding Dr. Olin Pia recommendations.  She is agreeable to having Inderal sent in for her.  I have advised her to use this cautiously with the prednisone and monitor her heart rates.   The patient voices understanding and is agreeable.  She is also requesting a sooner follow up with Dr. Caryl Comes to discuss her blood pressure going up & down. I have advised her we could do a virtual visit on 01/11/20 at 8:40 am.  The patient voices understanding and is agreeable.   I have updated her consent for this today.

## 2020-01-11 ENCOUNTER — Telehealth: Payer: Medicare HMO | Admitting: Internal Medicine

## 2020-01-16 ENCOUNTER — Other Ambulatory Visit: Payer: Self-pay | Admitting: Family Medicine

## 2020-01-18 ENCOUNTER — Other Ambulatory Visit: Payer: Self-pay | Admitting: Family Medicine

## 2020-01-18 DIAGNOSIS — R202 Paresthesia of skin: Secondary | ICD-10-CM

## 2020-01-18 DIAGNOSIS — R413 Other amnesia: Secondary | ICD-10-CM

## 2020-01-18 DIAGNOSIS — M62452 Contracture of muscle, left thigh: Secondary | ICD-10-CM

## 2020-02-06 ENCOUNTER — Other Ambulatory Visit: Payer: Self-pay | Admitting: Internal Medicine

## 2020-02-07 ENCOUNTER — Ambulatory Visit: Admission: RE | Admit: 2020-02-07 | Payer: Self-pay | Source: Ambulatory Visit

## 2020-02-07 ENCOUNTER — Ambulatory Visit: Payer: Self-pay

## 2020-02-14 ENCOUNTER — Ambulatory Visit: Payer: Self-pay

## 2020-02-14 ENCOUNTER — Ambulatory Visit: Admission: RE | Admit: 2020-02-14 | Payer: Medicare HMO | Source: Ambulatory Visit

## 2020-02-22 ENCOUNTER — Telehealth: Payer: Self-pay | Admitting: Internal Medicine

## 2020-09-07 ENCOUNTER — Telehealth: Payer: Self-pay | Admitting: Cardiology

## 2020-09-07 NOTE — Telephone Encounter (Signed)
Patient calling regarding low HR (in 40s)  She reports that she has h/o POTS, orthostatic hypotension, prior tachyarrhythmias- was on as needed propranolol, prev on metoprolol but d/cd due to low HR and also had episode of sinus arrest/asystole in the past.  She has been having fatigue, low HR since last few days, but her apple watch is showing that her HR has been low since past few months (as low as upper 40s). Today she checked her HR and before going to sleep was in 40s thus got very concerned and called [place].  She is feeling little weak, tired, fatigued but no dizziness, or syncope. Not on AV blocking agents. BP is ok  I advised her to take Kardia ECG. If her HR still is 40s and she is feeling poorly then she needs to come in to the ER tonight, asap for evaluation.  If her HR is improved with ambulation and Kardia ECG does not show any AV blocks then she could come in later for evaluation or contact Cardiology office. Either way, I have advised her to seek medical attention as early as possible given persistent low HR going on for months and now she is concerned with feeling tired- we wanna make sure there is no AVB or that she needs a pacemaker.  Patient undersands and is in agreement.  Renae Fickle, MD Cardiology coverage.

## 2020-09-09 ENCOUNTER — Encounter: Payer: Self-pay | Admitting: Internal Medicine

## 2020-09-09 ENCOUNTER — Telehealth: Payer: Self-pay | Admitting: Internal Medicine

## 2020-09-09 NOTE — Telephone Encounter (Signed)
STAT if HR is under 50 or over 120 (normal HR is 60-100 beats per minute)  What is your heart rate? 66  Do you have a log of your heart rate readings (document readings)? 40, 44, 45, 43, 48, 55  Do you have any other symptoms? Really tired, weak - wants an appt ASAP

## 2020-09-09 NOTE — Telephone Encounter (Signed)
I spoke with the patient.  She reports fatigue since earlier last week and has been noticing HR's in the 40's. She advised she called the on-call doctor one night as she was afraid to go to sleep with a HR of 48 bpm. She was advised to use her Kardia device, but this showed NSR (? HR reading at that time.)  The patient states she is due to go out of town Saturday and is just wanting to know what to do.  I inquired if she has used her Kardia device when she is actually seeing HR readings in the 40's as I am curious to know if she is seeing any PVC's/ PAC's.  The patient advised that she has not checked her HR with her Kardia device when she is seeing readings in the 40s.'  I have asked her to please use her [place] Red when her readings are low and send these to [place] through her MyChart.   I have also offered her an appointment in the morning with Dr. Caryl Comes if she preferred, but she will use her Kardia device first and we will go from there.

## 2020-09-10 ENCOUNTER — Encounter: Payer: Self-pay | Admitting: Internal Medicine

## 2020-11-20 ENCOUNTER — Telehealth: Payer: Self-pay | Admitting: Internal Medicine

## 2020-11-20 NOTE — Telephone Encounter (Signed)
Pt c/o of Chest Pain: STAT if CP now or developed within 24 hours  1. Are you having CP right now? NO  2. Are you experiencing any other symptoms (ex. SOB, nausea, vomiting, sweating)? Empty feeling in stomach. Pain behind left shoulder blade  3. How long have you been experiencing CP? Last night about 3:00 am  4. Is your CP continuous or coming and going? Continual   5. Have you taken Nitroglycerin? no ?

## 2020-11-20 NOTE — Telephone Encounter (Signed)
Call transferred directly to this RN from scheduling. The patient called today reporting that she woke up at ~ 3 am this morning with feelings of an empty stomach, but also having chest pain.  She advised she also had some pain inside her shoulder blade as well. She reports taking Gas-X at 3 am to see if this would help with her symptoms, but she had no relief.  She advised that she has had some intermittent dizziness and BP's have been a "little low' when checking while having symptoms of dizziness.  She had an episode while sitting on the couch yesterday as if she lost her hearing briefly.  I inquired if her symptoms of pain have improved since 3 am. Per the patient, symptoms are better, but not completely resolved. She advised that if she lifts up her left breast and presses in, that symptoms do seem to improve some.  Reviewed the patient's chart.  She has a history of dysautonomia, PVC's, PAC's & chest pain. She had a Cardiac CTA done on 06/05/19 with a Calcium Score of 0.   I inquired if the patient has done any lifting, pushing/ or pulling in the last few days and she advised she had done some of these things ~ the day before yesterday.   I have advised the patient that with a Calcium Score of 0 just over a year ago and the fact that her pain improves with pressing on her chest, I feel that the likelihood of her symptoms being cardiac in nature are low, but we cannot be certain.  I have advised her that I have no openings in office today, but could offer her an appointment tomorrow morning with Cadence Kathlen Mody, PA at 8:00 am/ 11:00 am. I have also advised in the interim to please reach out to her PCP, and if symptoms worsen in any way, she should report to the ER for further evaluation.   The patient voices understanding of the above.  She did want to go ahead and schedule for tomorrow at 11:00 am with Cadence, PA, but will reach out to her PCP today as well and if they can see her or if  they feel her cardiology appointment is not needed, she will call back to cancel for tomorrow.

## 2020-11-21 ENCOUNTER — Other Ambulatory Visit: Payer: Self-pay

## 2020-11-21 ENCOUNTER — Ambulatory Visit (INDEPENDENT_AMBULATORY_CARE_PROVIDER_SITE_OTHER): Payer: Self-pay | Admitting: Medical

## 2020-11-21 ENCOUNTER — Encounter: Payer: Self-pay | Admitting: Medical

## 2020-11-21 VITALS — BP 98/70 | HR 81 | Ht 65.0 in | Wt 151.2 lb

## 2020-11-21 DIAGNOSIS — I493 Ventricular premature depolarization: Secondary | ICD-10-CM

## 2020-11-21 DIAGNOSIS — G901 Familial dysautonomia [Riley-Day]: Secondary | ICD-10-CM

## 2020-11-21 DIAGNOSIS — R072 Precordial pain: Secondary | ICD-10-CM

## 2020-11-21 DIAGNOSIS — I951 Orthostatic hypotension: Secondary | ICD-10-CM

## 2020-11-21 DIAGNOSIS — I491 Atrial premature depolarization: Secondary | ICD-10-CM

## 2020-11-21 MED ORDER — PROPRANOLOL HCL 10 MG PO TABS: ORAL_TABLET | ORAL | 3 refills | Status: DC

## 2020-11-21 NOTE — Progress Notes (Signed)
Cardiology Office Note:    Date:  11/21/2020   ID:  Ariel Bryant, DOB 1971-01-02, MRN [id number]  PCP:  Denton Lank, MD  [facility] Cardiologist:  None  [facility] Electrophysiologist:  Caryl Comes  Referring MD: Denton Lank, MD   Chief Complaint: Chest pain  History of Present Illness:    Ariel Bryant is a 50 y.o. female with a hx of this dysautonomia, PVCs/PACs, palpitations, PTSA, orthostatic hypotension, chest pain, GI symptoms followed by [facility], sinus slowing and sinus arrest.  Echo 04/2017 showed LVEF 60 to 65%, no wall motion abnormality, normal diastolic parameters, no significant valvular abnormalities.  Patient had a flecainide exercise stress test 06/22/2018, this did not show wide-complex tachycardia.  Patient had a cardiac CT 06/22/2019 that showed calcium score of 0, no coronary calcium identified.  Patient follows with Dr. Caryl Comes.  Seen 2021 and was notable for orthostasis.  Still had palpitations.encouraged compression wear. Taking propranolol for tachypalpitations.   Today, the patient reports chest pain. Woke up two night ago with chest pain and left should pain. Had some stomach pain as well, empty sick feeling. Took 2 gas-x. Also was hurting in the left jaw. Hasn't felt great. Pain did not wake her up. Pain under the left breast. It was an ache, radiated into the shoulder. Had some more chest pain last night. Pain not worse on exertion. No SOB, nausea, vomiting. No LLE, orthopea, pnd, palpitations. Has not needed propranolol.   Has orthostatic dizzines. Orthostatics essentially positive. Not on daily bp medication. Occasionally feels dizzy. Has not been wearing compression socks or abdominal binder. Couldn't wear knee brace sicne foot would turn purple. Has lost weight, 190>148. Has been losing weight.   Past Medical History:  Diagnosis Date   Anxiety    GERD (gastroesophageal reflux disease)    PTSD (post-traumatic stress disorder)    Sinus tachycardia      Past Surgical History:  Procedure Laterality Date   BREAST BIOPSY Left 12/28/2016   Affirm Biopsy- Fibrocystic changes. RIbbon clip   CESAREAN SECTION     NSD x 3 followed by C/S for 8 + pounds breech   CHOLECYSTECTOMY     LAPAROSCOPIC OOPHERECTOMY     VAGINAL HYSTERECTOMY     had BTL, then VH, later developed ovarian mass and had ooph at 50 yo    Current Medications: Current Meds  Medication Sig   acetaminophen (TYLENOL) 325 MG tablet Take 650 mg by mouth as needed for mild pain, moderate pain, fever or headache.    acyclovir (ZOVIRAX) 400 MG tablet Take 400 mg by mouth as needed (AS NEEDED FOR COLD SORES).    Ascorbic Acid (VITAMIN C) 1000 MG tablet Take 1,000 mg by mouth daily.   aspirin EC 81 MG tablet Take 81 mg by mouth as needed. Swallow whole.   Calcium Carb-Cholecalciferol (CALCIUM + D3) 600-200 MG-UNIT TABS Take 1 tablet by mouth daily at 2 am.    cholecalciferol (VITAMIN D3) 25 MCG (1000 UT) tablet Take 1,000 Units by mouth daily.   clonazePAM (KLONOPIN) 2 MG tablet 2 (two) times daily as needed.   fluticasone (FLONASE) 50 MCG/ACT nasal spray Place 1 spray into both nostrils as needed. For Eustachian Tube Dysfunction   magnesium oxide (MAG-OX) 400 MG tablet TAKE 1 TABLET BY MOUTH EVERY DAY   montelukast (SINGULAIR) 10 MG tablet TAKE 1 TABLET BY MOUTH DAILY FOR ASTHMA AND ALLERGIES   niacin (SLO-NIACIN) 500 MG tablet Take 500 mg by mouth daily.  Omega-3 1000 MG CAPS qd   omeprazole (PRILOSEC) 40 MG capsule Take 40 mg by mouth daily.   potassium gluconate 595 (99 K) MG TABS tablet Take 595 mg by mouth daily.    vitamin B-12 (CYANOCOBALAMIN) 1000 MCG tablet Take 1,000 mcg by mouth as needed.    zinc gluconate 50 MG tablet Take 50 mg by mouth daily.   [DISCONTINUED] propranolol (INDERAL) 10 MG tablet Take 1 tablet (10 mg) by mouth every 8 hours as needed for tachypalpitations     Allergies:   Codeine, Dilaudid  [hydromorphone hcl], Sulfa antibiotics, Azithromycin,  Ciprofloxacin, Doxycycline monohydrate, and Doxycycline   Social History   Socioeconomic History   Marital status: Married    Spouse name: Not on file   Number of children: Not on file   Years of education: Not on file   Highest education level: Not on file  Occupational History   Not on file  Tobacco Use   Smoking status: Former   Smokeless tobacco: Never  Vaping Use   Vaping Use: Every day  Substance and Sexual Activity   Alcohol use: No   Drug use: No   Sexual activity: Not on file  Other Topics Concern   Not on file  Social History Narrative   Not on file   Social Determinants of Health   Financial Resource Strain: Not on file  Food Insecurity: Not on file  Transportation Needs: Not on file  Physical Activity: Not on file  Stress: Not on file  Social Connections: Not on file     Family History: The patient's family history includes Breast cancer in her paternal aunt and paternal aunt; Breast cancer (age of onset: 10) in her maternal aunt; Hypertension in her father.  ROS:   Please see the history of present illness.     All other systems reviewed and are negative.  EKGs/Labs/Other Studies Reviewed:    The following studies were reviewed today:  Cardiac CT 06/2019 IMPRESSION: Coronary calcium score of 0. This was 0 percentile for age and sex matched control.  IMPRESSION: 1. No coronary calcium identified. 2. No acute process in the imaged chest.   Electronically Signed: By: Abigail Miyamoto M.D. On: 06/05/2019 11:21  Echo 2019 Study Conclusions   - Left ventricle: The cavity size was normal. Wall thickness was    normal. Systolic function was normal. The estimated ejection    fraction was in the range of 60% to 65%. Wall motion was normal;    there were no regional wall motion abnormalities. Left    ventricular diastolic function parameters were normal.  - Right ventricle: The cavity size was normal. Wall thickness was    normal. Systolic function  was normal.  - No significant valvular abnormalities.    EKG:  EKG is  ordered today.  The ekg ordered today demonstrates NSR, 81bpm, no ST/T wave changes  Recent Labs: No results found for requested labs within last 8760 hours.  Recent Lipid Panel No results found for: CHOL, TRIG, HDL, CHOLHDL, VLDL, LDLCALC, LDLDIRECT   Physical Exam:    VS:  BP 98/70 (BP Location: Left Arm, Patient Position: Sitting, Cuff Size: Normal)   Pulse 81   Ht 5\' 5"  (1.651 m)   Wt 151 lb 4 oz (68.6 kg)   SpO2 96%   BMI 25.17 kg/m     Wt Readings from Last 3 Encounters:  11/21/20 151 lb 4 oz (68.6 kg)  10/03/19 186 lb 2 oz (84.4 kg)  04/20/19 190 lb (86.2 kg)     GEN:  Well nourished, well developed in no acute distress HEENT: Normal NECK: No JVD; No carotid bruits LYMPHATICS: No lymphadenopathy CARDIAC: RRR, no murmurs, rubs, gallops RESPIRATORY:  Clear to auscultation without rales, wheezing or rhonchi  ABDOMEN: Soft, non-tender, non-distended MUSCULOSKELETAL:  No edema; No deformity  SKIN: Warm and dry NEUROLOGIC:  Alert and oriented x 3 PSYCHIATRIC:  Normal affect   ASSESSMENT:    1. Precordial pain   2. Dysautonomia (Alpha)   3. PVC's (premature ventricular contractions)   4. PAC (premature atrial contraction)   5. Orthostatic hypotension    PLAN:    In order of problems listed above:  Chest pain Reports chest pain that sounds more atypical in nature. Prior Cardiac CT 2021 showed calcium score of 0 with no CAD. No significant RF for CAD. Echo in 2019 showed normal LVSF. Overall low suspicion for ACS. EKG with no St/T wave changes. She takes Aspirin, can continue this. We can see her back in a few months to reassess symptoms. If she still has chest pain can consider stress test.   Dizziness Orthostatic hypotension Not on antihypertensives at baseline. Orthostatics today essentially positive. She reports occasional dizziness, however does not greatly affect her overall function. Has  not been wearing compression socks or binder with knee issues, recommend she re-start this. Also encouraged good hydration and increased salt intake. Will defer adding medications to MD.   PACs/PVCs Takes propranolol as needed, has not needed it. Will refill this.   Disposition: Follow up in 3 month(s) with Md    Signed, Relena Ivancic Ninfa Meeker, PA-C  11/21/2020 11:39 AM    [place]

## 2020-11-21 NOTE — Patient Instructions (Signed)
Medication Instructions:  Refill has been sent in for your propranolol. No other changes at this time.  *If you need a refill on your cardiac medications before your next appointment, please call your pharmacy*   Lab Work: None  If you have labs (blood work) drawn today and your tests are completely normal, you will receive your results only by: [place] (if you have MyChart) OR A paper copy in the mail If you have any lab test that is abnormal or we need to change your treatment, we will call you to review the results.   Testing/Procedures: None    Follow-Up: At [facility], you and your health needs are our priority.  As part of our continuing mission to provide you with exceptional heart care, we have created designated Provider Care Teams.  These Care Teams include your primary Cardiologist (physician) and Advanced Practice Providers (APPs -  Physician Assistants and Nurse Practitioners) who all work together to provide you with the care you need, when you need it.   Your next appointment:   3 month(s)  The format for your next appointment:   In Person  Provider:   Virl Axe, MD

## 2021-01-02 ENCOUNTER — Other Ambulatory Visit: Payer: Self-pay | Admitting: Family Medicine

## 2021-01-02 DIAGNOSIS — R059 Cough, unspecified: Secondary | ICD-10-CM

## 2021-02-06 ENCOUNTER — Other Ambulatory Visit: Payer: Self-pay | Admitting: Family Medicine

## 2021-02-06 DIAGNOSIS — Z1231 Encounter for screening mammogram for malignant neoplasm of breast: Secondary | ICD-10-CM

## 2021-02-19 ENCOUNTER — Other Ambulatory Visit: Payer: Self-pay | Admitting: Family Medicine

## 2021-02-19 DIAGNOSIS — Z1231 Encounter for screening mammogram for malignant neoplasm of breast: Secondary | ICD-10-CM

## 2021-02-19 DIAGNOSIS — N63 Unspecified lump in unspecified breast: Secondary | ICD-10-CM

## 2021-02-19 DIAGNOSIS — N6320 Unspecified lump in the left breast, unspecified quadrant: Secondary | ICD-10-CM

## 2021-02-19 DIAGNOSIS — N6459 Other signs and symptoms in breast: Secondary | ICD-10-CM

## 2021-02-25 ENCOUNTER — Ambulatory Visit: Payer: Self-pay | Admitting: Internal Medicine

## 2021-02-27 ENCOUNTER — Ambulatory Visit: Payer: Self-pay | Admitting: Internal Medicine

## 2021-03-06 ENCOUNTER — Other Ambulatory Visit: Payer: Self-pay

## 2021-03-06 ENCOUNTER — Ambulatory Visit
Admission: RE | Admit: 2021-03-06 | Discharge: 2021-03-06 | Disposition: A | Discharge status: Home / Self Care | Payer: BC Managed Care – PPO | Source: Ambulatory Visit | Attending: Family Medicine | Admitting: Family Medicine

## 2021-03-06 DIAGNOSIS — Z1231 Encounter for screening mammogram for malignant neoplasm of breast: Secondary | ICD-10-CM | POA: Diagnosis present

## 2021-03-06 DIAGNOSIS — N63 Unspecified lump in unspecified breast: Secondary | ICD-10-CM | POA: Diagnosis present

## 2021-03-06 DIAGNOSIS — N6459 Other signs and symptoms in breast: Secondary | ICD-10-CM

## 2021-07-07 ENCOUNTER — Telehealth: Payer: Self-pay | Admitting: Internal Medicine

## 2021-07-07 NOTE — Telephone Encounter (Signed)
Scheduled patient 6/15 added to waitlist .

## 2021-07-07 NOTE — Telephone Encounter (Signed)
I called and spoke with the patient. She describes the following symptoms: - bilateral calf tightness - feeling of fluid around the knees - swelling to the hands/ face - almost feels like she is developing a cough at times  Symptoms have been ongoing since Saturday 07/05/21. She called her PCP on call Saturday as she was also having what seemed like SOB. She was advised to go the ER to r/o CHF/ DVT.  The patient did go to [facility]. A chest x-ray was done that was WNL. EKG was performed, but the patient was not notified of the results. There was a 3+ hours wait in the ER and her legs were swelling more the longer she sat there so she left. Swelling seem to improve some with elevation. She has decreased her sodium intake over the weekend (needs salt due to Dysautonomia).   She is not weighing daily.  I have advised the patient I am not certain what the source of her swelling is. I have offered a sooner appointment on 07/10/21 with Dr. Caryl Comes at 8:40 am to discuss further. She is agreeable with this appointment.   Confirmed with her that her legs are not painful to touch/ hot. She is aware to call back sooner if needed.

## 2021-07-07 NOTE — Telephone Encounter (Addendum)
Pt c/o swelling: STAT is pt has developed SOB within 24 hours  If swelling, where is the swelling located? Patient is having swelling at her hand and feet on both sides.   How much weight have you gained and in what time span? She has gained weight, but is not sure how much.   Have you gained 3 pounds in a day or 5 pounds in a week? Unsure.  Do you have a log of your daily weights (if so, list)? no  Are you currently taking a fluid pill? no  Are you currently SOB? Just a little bit.  Since Saturday morning.   Have you traveled recently? no

## 2021-07-10 ENCOUNTER — Ambulatory Visit (INDEPENDENT_AMBULATORY_CARE_PROVIDER_SITE_OTHER): Payer: BC Managed Care – PPO | Admitting: Internal Medicine

## 2021-07-10 DIAGNOSIS — I493 Ventricular premature depolarization: Secondary | ICD-10-CM

## 2021-07-10 DIAGNOSIS — I951 Orthostatic hypotension: Secondary | ICD-10-CM

## 2021-07-10 DIAGNOSIS — R072 Precordial pain: Secondary | ICD-10-CM

## 2021-07-10 DIAGNOSIS — I10 Essential (primary) hypertension: Secondary | ICD-10-CM

## 2021-07-10 DIAGNOSIS — G901 Familial dysautonomia [Riley-Day]: Secondary | ICD-10-CM

## 2021-07-10 DIAGNOSIS — I491 Atrial premature depolarization: Secondary | ICD-10-CM

## 2021-07-10 NOTE — Progress Notes (Signed)
Error

## 2021-07-11 ENCOUNTER — Encounter: Payer: Self-pay | Admitting: Internal Medicine

## 2021-07-17 ENCOUNTER — Ambulatory Visit: Payer: BC Managed Care – PPO | Admitting: Medical

## 2022-01-03 ENCOUNTER — Other Ambulatory Visit: Payer: Self-pay | Admitting: Medical

## 2022-01-05 ENCOUNTER — Telehealth: Payer: Self-pay | Admitting: Internal Medicine

## 2022-01-05 NOTE — Telephone Encounter (Signed)
I spoke with the patient.  She states that she had COVID ~ 2 months ago. She now has noted that for some reason her right pupil is larger than her left pupil. She is scheduled for a Brain MRI w/wo contrast tomorrow to follow up on this.   However she also noted that her potassium levels have been running borderline low and her PCP put her on potassium 10 meq once daily. She states that she hasn't taken this for the last 2 days due to feeling funny in her chest/ pressure. She has not had symptoms since stopping potassium.   She called to inquire if we thought the potassium could have caused the unusual feelings in her chest.   I advised the patient that looking back over her  potassium levels, she typically will run 3.6-4.2. Her lowest potassium was 3.1 back in 2019. I also advised that Dr. Caryl Comes wouldn't typically start a potassium supplement if K+ levels are WNL.  The patient is aware that I cannot confirm if her symptoms were related to her potassium, but if her PCP started this and she wants to re-trial it again to see if symptoms re-occur, this would be the only way to know if the potassium supplement was a trigger for her symptoms.   The patient also advised that she is using her propranolol as needed for palpitations.  She is aware to hydrate when taking this due to low BPs.  The patient is scheduled to follow up in February with Dr. Caryl Comes, but will call back sooner if needed.   She was appreciative of the call today.

## 2022-01-05 NOTE — Telephone Encounter (Signed)
Pt c/o medication issue:  1. Name of Medication: potassium gluconate 595 (99 K) MG TABS tablet   2. How are you currently taking this medication (dosage and times per day)? Has not taken in 2 days  3. Are you having a reaction (difficulty breathing--STAT)? no  4. What is your medication issue? Patient says she was prescribed potassium because at her neurology appointment her potassium was low. She says she skipped taking it the last 2 days, because she feels weird in her chest when she takes it. She says every time she has been to hospital her potassium was low. She says when she had syncope in the past her potassium was low.

## 2022-03-12 ENCOUNTER — Other Ambulatory Visit: Payer: Self-pay | Admitting: Family Medicine

## 2022-03-12 ENCOUNTER — Ambulatory Visit: Payer: Managed Care, Other (non HMO) | Attending: Internal Medicine | Admitting: Internal Medicine

## 2022-03-12 ENCOUNTER — Encounter: Payer: Self-pay | Admitting: Internal Medicine

## 2022-03-12 ENCOUNTER — Other Ambulatory Visit
Admission: RE | Admit: 2022-03-12 | Discharge: 2022-03-12 | Disposition: A | Discharge status: Home / Self Care | Payer: Managed Care, Other (non HMO) | Attending: Internal Medicine | Admitting: Internal Medicine

## 2022-03-12 VITALS — BP 112/86 | HR 81 | Ht 64.0 in | Wt 177.0 lb

## 2022-03-12 DIAGNOSIS — I493 Ventricular premature depolarization: Secondary | ICD-10-CM

## 2022-03-12 DIAGNOSIS — I491 Atrial premature depolarization: Secondary | ICD-10-CM

## 2022-03-12 DIAGNOSIS — I951 Orthostatic hypotension: Secondary | ICD-10-CM | POA: Diagnosis not present

## 2022-03-12 DIAGNOSIS — G901 Familial dysautonomia [Riley-Day]: Secondary | ICD-10-CM

## 2022-03-12 DIAGNOSIS — Z1231 Encounter for screening mammogram for malignant neoplasm of breast: Secondary | ICD-10-CM

## 2022-03-12 NOTE — Progress Notes (Signed)
Patient Care Team: Gladstone Lighter, MD as PCP - General (Internal Medicine)   HPI  Ariel Bryant is a 52 y.o. female Seen in follow-up for symptoms consistent with dysautonomia and PVCs. She's had significant GI symptoms has been seen at  [facility] who felt GI symptoms were secondary to dysautonomia 2019  While being hooked up to the electrocardiogram she had sinus slowing and sinus arrest.  This occurred on a second occasion. Symptomatic PVC/PACs rx flecainide >> propafenone now stopped and taking inderal prn, took once with chest pain  Interval covid  Some palpitations.  Lightheadedness is not too bad.  GI symptoms are also relatively quiescient.  Has noted that her blood pressure has been up, her diet has been relatively replete with sodium.  Does not snore.  Weight is up about 20 pounds.  Not sure why.  No exercising 3 days a week.  Has stopped smoking.         Date Cr K Mg Hgb TSH  3/19  3.1     4/19 0.84 4.2 2.3 13.4 0.584  11/20  0.72 3.5   1.5  3/21 0.62 3.9   1.5  11/23 0.7 3.7  12.4 1.2    DATE TEST EF   3/19 Echo   60-65 %   5/21 CaScore   % Score = 0           Complaints of chest pain prompted calcium scoring 5/21>>0  Records and Results Reviewed. As above   Past Medical History:  Diagnosis Date   Anxiety    GERD (gastroesophageal reflux disease)    PTSD (post-traumatic stress disorder)    Sinus tachycardia     Past Surgical History:  Procedure Laterality Date   BREAST BIOPSY Left 12/28/2016   Affirm Biopsy- Fibrocystic changes. RIbbon clip   CESAREAN SECTION     NSD x 3 followed by C/S for 8 + pounds breech   CHOLECYSTECTOMY     LAPAROSCOPIC OOPHERECTOMY     VAGINAL HYSTERECTOMY     had BTL, then VH, later developed ovarian mass and had ooph at 52 yo    Current Outpatient Medications  Medication Sig Dispense Refill   acetaminophen (TYLENOL) 325 MG tablet Take 650 mg by mouth as needed for mild pain, moderate pain, fever or  headache.      acyclovir (ZOVIRAX) 400 MG tablet Take 400 mg by mouth as needed (AS NEEDED FOR COLD SORES).      amoxicillin-clavulanate (AUGMENTIN) 875-125 MG tablet Take 1 tablet by mouth 2 (two) times daily.     Ascorbic Acid (VITAMIN C) 1000 MG tablet Take 1,000 mg by mouth daily.     aspirin EC 81 MG tablet Take 81 mg by mouth as needed. Swallow whole.     Cholecalciferol (D3 ADULT PO) Take 1,000 Units by mouth daily.     clonazePAM (KLONOPIN) 2 MG tablet 2 (two) times daily as needed.     fluticasone (FLONASE) 50 MCG/ACT nasal spray Place 1 spray into both nostrils as needed. For Eustachian Tube Dysfunction     magnesium oxide (MAG-OX) 400 MG tablet TAKE 1 TABLET BY MOUTH EVERY DAY 90 tablet 0   montelukast (SINGULAIR) 10 MG tablet TAKE 1 TABLET BY MOUTH DAILY FOR ASTHMA AND ALLERGIES     Omega-3 1000 MG CAPS qd     omeprazole (PRILOSEC) 40 MG capsule Take 40 mg by mouth daily.     potassium chloride (KLOR-CON) 10 MEQ tablet Take 10  mEq by mouth daily.     propranolol (INDERAL) 10 MG tablet TAKE 1 TABLET (10 MG) BY MOUTH EVERY 8 HOURS AS NEEDED FOR TACHYPALPITATIONS 5 tablet 23   zinc gluconate 50 MG tablet Take 50 mg by mouth daily.     No current facility-administered medications for this visit.    Allergies  Allergen Reactions   Codeine Shortness Of Breath   Dilaudid  [Hydromorphone Hcl] Tinitus   Sulfa Antibiotics Anaphylaxis and Other (See Comments)   Azithromycin Other (See Comments)   Ciprofloxacin Other (See Comments)   Doxycycline Monohydrate Other (See Comments)   Doxycycline Rash      Review of Systems negative except from HPI and PMH  Physical Exam BP 112/86 (BP Location: Left Arm, Patient Position: Sitting, Cuff Size: Normal)   Pulse 81   Ht '5\' 4"'$  (1.626 m)   Wt 177 lb (80.3 kg)   SpO2 98%   BMI 30.38 kg/m  Well developed and nourished in no acute distress HENT normal Neck supple  Clear Regular rate and rhythm, no murmurs or gallops Abd-soft with  active BS No Clubbing cyanosis edema Skin-warm and dry A & Oriented  Grossly normal sensory and motor function  ECG sinus at 81 Interval 14/07/38 Otherwise normal    Assessment and  Plan  Dysautonomia  PVCs/PACs  Palpitations  Monitor>> PACs but no PVCs  Psychosocial stress/PTSD   Sinus Arrest and syncope  Orthostatic Hypotension  Hypertension-predominantly diastolic  Chest pain   Swelling  Palpitations are infrequent, we will continue with her as needed Inderal and I suggested that she take it 1 day just to make sure she tolerates it, if she does not, we will give her prescription for metoprolo tartrate 25; continue mag oxide  Pressure is elevated.  For right now we will continue to have her exercise.  Increase the potassium in her diet and try to decrease the sodium in her diet.  She has a parathyroid nodule and a breast lesion.  She has not engaged in the recommended follow-up.  Long discussion in encouraging her to do so.

## 2022-03-12 NOTE — Patient Instructions (Addendum)
Medication Instructions:  Your physician recommends that you continue on your current medications as directed. Please refer to the Current Medication list given to you today.  *If you need a refill on your cardiac medications before your next appointment, please call your pharmacy*   Lab Work: Your physician recommends that you get lab work: UA If you have labs (blood work) drawn today and your tests are completely normal, you will receive your results only by: [facility] (if you have MyChart) OR A paper copy in the mail If you have any lab test that is abnormal or we need to change your treatment, we will call you to review the results.   Testing/Procedures: -None ordered   Follow-Up: At [facility], you and your health needs are our priority.  As part of our continuing mission to provide you with exceptional heart care, we have created designated Provider Care Teams.  These Care Teams include your primary Cardiologist (physician) and Advanced Practice Providers (APPs -  Physician Assistants and Nurse Practitioners) who all work together to provide you with the care you need, when you need it.  We recommend signing up for the patient portal called "MyChart".  Sign up information is provided on this After Visit Summary.  MyChart is used to connect with patients for Virtual Visits (Telemedicine).  Patients are able to view lab/test results, encounter notes, upcoming appointments, etc.  Non-urgent messages can be sent to your provider as well.   To learn more about what you can do with MyChart, go to [email or web address].    Your next appointment:   1 year(s)  Provider:   Virl Axe, MD    Other Instructions -None

## 2022-03-13 ENCOUNTER — Ambulatory Visit
Admission: RE | Admit: 2022-03-13 | Discharge: 2022-03-13 | Disposition: A | Discharge status: Home / Self Care | Payer: Managed Care, Other (non HMO) | Source: Ambulatory Visit | Attending: Family Medicine | Admitting: Family Medicine

## 2022-03-13 DIAGNOSIS — Z1231 Encounter for screening mammogram for malignant neoplasm of breast: Secondary | ICD-10-CM

## 2022-03-17 ENCOUNTER — Encounter: Payer: Self-pay | Admitting: Internal Medicine

## 2022-03-20 ENCOUNTER — Encounter: Payer: Self-pay | Admitting: Internal Medicine

## 2022-03-20 ENCOUNTER — Other Ambulatory Visit: Payer: Self-pay

## 2022-03-20 DIAGNOSIS — R102 Pelvic and perineal pain: Secondary | ICD-10-CM

## 2022-03-23 ENCOUNTER — Encounter: Payer: Self-pay | Admitting: Internal Medicine

## 2022-04-06 NOTE — Progress Notes (Unsigned)
Patient Care Team: Gladstone Lighter, MD as PCP - General (Internal Medicine)   HPI  Ariel Bryant is a 52 y.o. female Seen in follow-up for symptoms consistent with dysautonomia and PVCs. She's had significant GI symptoms has been seen at  [facility] who felt GI symptoms were secondary to dysautonomia 2019  While being hooked up to the electrocardiogram she had sinus slowing and sinus arrest.  This occurred on a second occasion. Symptomatic PVC/PACs rx flecainide >> propafenone now stopped and taking inderal prn, took once with chest pain  Interval covid  Some palpitations.  Lightheadedness is not too bad.  GI symptoms are also relatively quiescient.  Has noted that her blood pressure has been up, her diet has been relatively replete with sodium.  Does not snore.  Weight is up about 20 pounds.  Not sure why.  No exercising 3 days a week.  Has stopped smoking.         Date Cr K Mg Hgb TSH  3/19  3.1     4/19 0.84 4.2 2.3 13.4 0.584  11/20  0.72 3.5   1.5  3/21 0.62 3.9   1.5  11/23 0.7 3.7  12.4 1.2    DATE TEST EF   3/19 Echo   60-65 %   5/21 CaScore   % Score = 0           Complaints of chest pain prompted calcium scoring 5/21>>0  Records and Results Reviewed. As above   Past Medical History:  Diagnosis Date   Anxiety    GERD (gastroesophageal reflux disease)    PTSD (post-traumatic stress disorder)    Sinus tachycardia     Past Surgical History:  Procedure Laterality Date   BREAST BIOPSY Left 12/28/2016   Affirm Biopsy- Fibrocystic changes. RIbbon clip   CESAREAN SECTION     NSD x 3 followed by C/S for 8 + pounds breech   CHOLECYSTECTOMY     LAPAROSCOPIC OOPHERECTOMY     VAGINAL HYSTERECTOMY     had BTL, then VH, later developed ovarian mass and had ooph at 52 yo    Current Outpatient Medications  Medication Sig Dispense Refill   acetaminophen (TYLENOL) 325 MG tablet Take 650 mg by mouth as needed for mild pain, moderate pain, fever or  headache.      acyclovir (ZOVIRAX) 400 MG tablet Take 400 mg by mouth as needed (AS NEEDED FOR COLD SORES).      Ascorbic Acid (VITAMIN C) 1000 MG tablet Take 1,000 mg by mouth daily.     aspirin EC 81 MG tablet Take 81 mg by mouth as needed. Swallow whole.     Cholecalciferol (D3 ADULT PO) Take 1,000 Units by mouth daily.     clonazePAM (KLONOPIN) 2 MG tablet 2 (two) times daily as needed.     fluticasone (FLONASE) 50 MCG/ACT nasal spray Place 1 spray into both nostrils as needed. For Eustachian Tube Dysfunction     magnesium oxide (MAG-OX) 400 MG tablet TAKE 1 TABLET BY MOUTH EVERY DAY 90 tablet 0   montelukast (SINGULAIR) 10 MG tablet TAKE 1 TABLET BY MOUTH DAILY FOR ASTHMA AND ALLERGIES     Omega-3 1000 MG CAPS qd     omeprazole (PRILOSEC) 40 MG capsule Take 40 mg by mouth daily.     potassium chloride (KLOR-CON) 10 MEQ tablet Take 10 mEq by mouth daily.     propranolol (INDERAL) 10 MG tablet TAKE 1 TABLET (10 MG)  BY MOUTH EVERY 8 HOURS AS NEEDED FOR TACHYPALPITATIONS 5 tablet 23   zinc gluconate 50 MG tablet Take 50 mg by mouth daily.     No current facility-administered medications for this visit.    Allergies  Allergen Reactions   Codeine Shortness Of Breath   Dilaudid  [Hydromorphone Hcl] Tinitus   Sulfa Antibiotics Anaphylaxis and Other (See Comments)   Azithromycin Other (See Comments)   Ciprofloxacin Other (See Comments)   Doxycycline Monohydrate Other (See Comments)   Doxycycline Rash      Review of Systems negative except from HPI and PMH  Physical Exam There were no vitals taken for this visit. Well developed and nourished in no acute distress HENT normal Neck supple  Clear Regular rate and rhythm, no murmurs or gallops Abd-soft with active BS No Clubbing cyanosis edema Skin-warm and dry A & Oriented  Grossly normal sensory and motor function  ECG sinus at 81 Interval 14/07/38 Otherwise normal    Assessment and   Plan  Dysautonomia  PVCs/PACs  Palpitations  Monitor>> PACs but no PVCs  Psychosocial stress/PTSD   Sinus Arrest and syncope  Orthostatic Hypotension  Hypertension-predominantly diastolic  Chest pain   Swelling  Palpitations are infrequent, we will continue with her as needed Inderal and I suggested that she take it 1 day just to make sure she tolerates it, if she does not, we will give her prescription for metoprolo tartrate 25; continue mag oxide  Pressure is elevated.  For right now we will continue to have her exercise.  Increase the potassium in her diet and try to decrease the sodium in her diet.  She has a parathyroid nodule and a breast lesion.  She has not engaged in the recommended follow-up.  Long discussion in encouraging her to do so.

## 2022-04-07 ENCOUNTER — Encounter: Payer: Self-pay | Admitting: Internal Medicine

## 2022-04-07 ENCOUNTER — Ambulatory Visit: Payer: Managed Care, Other (non HMO) | Attending: Internal Medicine | Admitting: Internal Medicine

## 2022-04-07 VITALS — BP 118/90 | HR 98 | Ht 64.0 in | Wt 179.0 lb

## 2022-04-07 DIAGNOSIS — I493 Ventricular premature depolarization: Secondary | ICD-10-CM | POA: Diagnosis not present

## 2022-04-29 ENCOUNTER — Encounter: Payer: Self-pay | Admitting: Internal Medicine

## 2022-04-29 DIAGNOSIS — R000 Tachycardia, unspecified: Secondary | ICD-10-CM

## 2022-04-29 DIAGNOSIS — I493 Ventricular premature depolarization: Secondary | ICD-10-CM

## 2022-04-29 DIAGNOSIS — R002 Palpitations: Secondary | ICD-10-CM

## 2022-04-30 ENCOUNTER — Other Ambulatory Visit: Payer: Self-pay | Admitting: *Deleted

## 2022-04-30 MED ORDER — PROPRANOLOL HCL 10 MG PO TABS: ORAL_TABLET | ORAL | 1 refills | Status: AC

## 2022-05-07 ENCOUNTER — Ambulatory Visit: Payer: Managed Care, Other (non HMO) | Attending: Internal Medicine

## 2022-05-07 DIAGNOSIS — R000 Tachycardia, unspecified: Secondary | ICD-10-CM

## 2022-05-07 DIAGNOSIS — R002 Palpitations: Secondary | ICD-10-CM

## 2022-05-11 DIAGNOSIS — R002 Palpitations: Secondary | ICD-10-CM | POA: Diagnosis not present

## 2022-05-11 DIAGNOSIS — R000 Tachycardia, unspecified: Secondary | ICD-10-CM | POA: Diagnosis not present

## 2022-05-15 ENCOUNTER — Telehealth: Payer: Self-pay

## 2022-05-15 NOTE — Telephone Encounter (Signed)
Incoming email from [facility],     This email is to notify you that the patient listed below had an early fall-off issue where the patch fell off in the first 48 hours of wear. We have placed an order for a replacement device to be shipped to the patient and placed a hold on the billing, so the patient is not charged for the first device. If you have any questions, please respond to this email, or call us at [phone].     Patient initials: Ariel Bryant  Patient ID: [id number]  SN: [id number]  Ticket: [id number] ORDER: [id number]   Account: [facility] [place]     Thank you,  Kandis Fantasia Customer Care

## 2022-07-25 ENCOUNTER — Encounter: Payer: Self-pay | Admitting: Internal Medicine

## 2022-10-22 ENCOUNTER — Encounter: Payer: Self-pay | Admitting: Internal Medicine

## 2022-12-02 ENCOUNTER — Encounter: Payer: Self-pay | Admitting: Internal Medicine

## 2022-12-14 NOTE — Progress Notes (Deleted)
Cardiology Office Note Date:  12/14/2022  Patient ID:  Ariel Bryant, Ariel Bryant April 21, 1970, MRN [id number] PCP:  Ariel Baas, MD  Cardiologist:  None Electrophysiologist: Ariel Manges, MD  ***refresh   Chief Complaint: ***  History of Present Illness: Ariel Bryant is a 52 y.o. female with PMH notable for PVCs, PACs, HTN, orthostatic hypotension, dysautonomia, sinus arrest; seen today for Ariel Manges, MD for acute visit due to labile heart rate.   She last saw Ariel Bryant 04/2022  On follow-up today,  *** how often taking propranolol *** fluid *** compression - abd? *** salt diet  ?midodrine  Patient reports ***.  she denies chest pain, palpitations, dyspnea, PND, orthopnea, nausea, vomiting, dizziness, syncope, edema, weight gain, or early satiety.       AAD History: Flecainide - headaches Propafenone   Past Medical History:  Diagnosis Date   Anxiety    GERD (gastroesophageal reflux disease)    PTSD (post-traumatic stress disorder)    Sinus tachycardia     Past Surgical History:  Procedure Laterality Date   BREAST BIOPSY Left 12/28/2016   Affirm Biopsy- Fibrocystic changes. RIbbon clip   CESAREAN SECTION     NSD x 3 followed by C/S for 8 + pounds breech   CHOLECYSTECTOMY     LAPAROSCOPIC OOPHERECTOMY     VAGINAL HYSTERECTOMY     had BTL, then VH, later developed ovarian mass and had ooph at 52 yo    Current Outpatient Medications  Medication Instructions   acetaminophen (TYLENOL) 650 mg, Oral, As needed   acyclovir (ZOVIRAX) 400 mg, Oral, As needed   aspirin EC 81 mg, Oral, As needed, Swallow whole.    Cholecalciferol (D3 ADULT PO) 1,000 Units, Oral, Daily   clonazePAM (KLONOPIN) 2 MG tablet 2 times daily PRN   fluticasone (FLONASE) 50 MCG/ACT nasal spray 1 spray, Each Nare, As needed, For Eustachian Tube Dysfunction   magnesium oxide (MAG-OX) 400 MG tablet TAKE 1 TABLET BY MOUTH EVERY DAY   montelukast (SINGULAIR) 10 MG tablet TAKE 1 TABLET BY  MOUTH DAILY FOR ASTHMA AND ALLERGIES   Omega-3 1000 MG CAPS qd   omeprazole (PRILOSEC) 40 mg, Oral, Daily   potassium chloride (KLOR-CON) 10 MEQ tablet 10 mEq, Oral, Daily   propranolol (INDERAL) 10 MG tablet Take 1 tablet (10 mg) by mouth every 8 hours as needed for tachy palpitations   vitamin C 1,000 mg, Oral, Daily   zinc gluconate 50 mg, Oral, Daily    Social History:  The patient  reports that she has quit smoking. She has never used smokeless tobacco. She reports that she does not drink alcohol and does not use drugs.   Family History:  *** include only if pertinent The patient's family history includes Breast cancer in her paternal aunt and paternal aunt; Breast cancer (age of onset: 17) in her maternal aunt; Diabetes in her father; Hypertension in her father; Stroke in her father and sister.***  ROS:  Please see the history of present illness. All other systems are reviewed and otherwise negative.   PHYSICAL EXAM: *** VS:  There were no vitals taken for this visit. BMI: There is no height or weight on file to calculate BMI.  GEN- The patient is well appearing, alert and oriented x 3 today.   Lungs- Clear to ausculation bilaterally, normal work of breathing.  Heart- {Blank single:19197::"Regular","Irregularly irregular"} rate and rhythm, no murmurs, rubs or gallops Extremities- {EDEMA LEVEL:28147::"No"} peripheral edema, warm, dry   EKG is ordered.  Personal review of EKG from today shows:  ***        Recent Labs: No results found for requested labs within last 365 days.  No results found for requested labs within last 365 days.   CrCl cannot be calculated (Patient's most recent lab result is older than the maximum 21 days allowed.).   Wt Readings from Last 3 Encounters:  04/07/22 179 lb (81.2 kg)  03/12/22 177 lb (80.3 kg)  11/21/20 151 lb 4 oz (68.6 kg)     Additional studies reviewed include: Previous EP, cardiology notes.   TTE, 12/09/2022 [facility] via care  everywhere) NORMAL LEFT VENTRICULAR SYSTOLIC FUNCTION WITH MILD LVH  ESTIMATED EF: >55%  NORMAL LA PRESSURES WITH NORMAL DIASTOLIC FUNCTION  NORMAL RIGHT VENTRICULAR SYSTOLIC FUNCTION  VALVULAR REGURGITATION: TRIVIAL AR, TRIVIAL MR, TRIVIAL PR, TRIVIAL TR  NO VALVULAR STENOSIS   Long Term Monitor, 06/09/2022 Monitor 1 Patient had a min HR of 59 bpm, max HR of 167 bpm, and avg HR of 98 bpm. Predominant underlying rhythm was Sinus Rhythm. 2 Supraventricular Tachycardia runs occurred, the run with the fastest interval lasting 4 beats with a max rate of 167 bpm, the  longest lasting 4 beats with an avg rate of 144 bpm. Isolated SVEs were rare (<1.0%), and no SVE Couplets or SVE Triplets were present. No Isolated VEs, VE Couplets, or VE Triplets were present.    Monitor 2 Patient had a min HR of 50 bpm, max HR of 168 bpm, and avg HR of 95 bpm. Predominant underlying rhythm was Sinus Rhythm. 2 Supraventricular Tachycardia runs occurred, the run with the fastest interval lasting 4 beats with a max rate of 136 bpm, the  longest lasting 10 beats with an avg rate of 113 bpm. Supraventricular Tachycardia was detected within +/- 45 seconds of symptomatic patient event(s). Isolated SVEs were rare (<1.0%), SVE Couplets were rare (<1.0%), and SVE Triplets were rare (<1.0%).  Isolated VEs were rare (<1.0%), VE Couplets were rare (<1.0%), and no VE Triplets were present.    Triggered events assoc with sinus ( but rates up to 125)  Consistent with known dysautonomia   Short runs of atrial tachycardia which were not sensed were also noted  CT cardiac scoring, 06/05/2019 Coronary calcium score of 0. This was 0 percentile for age and sex matched control.         ASSESSMENT AND PLAN:  #) dysautonomia #) palpitations   #) ***   {Are you ordering a CV Procedure (e.g. stress test, cath, DCCV, TEE, etc)?   Press F2        :[id number]}   Current medicines are reviewed at length with the patient today.    The patient {ACTIONS; HAS/DOES NOT HAVE:19233} concerns regarding her medicines.  The following changes were made today:  {NONE DEFAULTED:18576}  Labs/ tests ordered today include: *** No orders of the defined types were placed in this encounter.    Disposition: Follow up with {EPMDS:28135} or EP APP {EPFOLLOW UP:28173}   Signed, Sherie Don, NP  12/14/22  7:59 PM  Electrophysiology [facility]

## 2022-12-15 ENCOUNTER — Ambulatory Visit: Payer: Managed Care, Other (non HMO) | Attending: Cardiology | Admitting: Cardiology

## 2022-12-15 DIAGNOSIS — G901 Familial dysautonomia [Riley-Day]: Secondary | ICD-10-CM

## 2022-12-15 DIAGNOSIS — R002 Palpitations: Secondary | ICD-10-CM

## 2023-01-06 ENCOUNTER — Other Ambulatory Visit (INDEPENDENT_AMBULATORY_CARE_PROVIDER_SITE_OTHER): Payer: Self-pay | Admitting: Neurology

## 2023-01-06 DIAGNOSIS — I7090 Unspecified atherosclerosis: Secondary | ICD-10-CM

## 2023-02-01 ENCOUNTER — Ambulatory Visit: Payer: Managed Care, Other (non HMO) | Attending: Internal Medicine | Admitting: Internal Medicine

## 2023-02-01 ENCOUNTER — Encounter: Payer: Self-pay | Admitting: Internal Medicine

## 2023-02-01 VITALS — BP 101/68 | HR 100 | Ht 64.0 in | Wt 182.6 lb

## 2023-02-01 DIAGNOSIS — R0602 Shortness of breath: Secondary | ICD-10-CM

## 2023-02-01 DIAGNOSIS — I493 Ventricular premature depolarization: Secondary | ICD-10-CM | POA: Diagnosis not present

## 2023-02-01 DIAGNOSIS — I491 Atrial premature depolarization: Secondary | ICD-10-CM | POA: Diagnosis not present

## 2023-02-01 DIAGNOSIS — G901 Familial dysautonomia [Riley-Day]: Secondary | ICD-10-CM

## 2023-02-01 DIAGNOSIS — I951 Orthostatic hypotension: Secondary | ICD-10-CM

## 2023-02-01 DIAGNOSIS — R0609 Other forms of dyspnea: Secondary | ICD-10-CM

## 2023-02-01 DIAGNOSIS — U099 Post covid-19 condition, unspecified: Secondary | ICD-10-CM

## 2023-02-01 NOTE — Patient Instructions (Signed)
Medication Instructions:  Your physician recommends that you continue on your current medications as directed. Please refer to the Current Medication list given to you today.  *If you need a refill on your cardiac medications before your next appointment, please call your pharmacy*   Lab Work: None ordered.  If you have labs (blood work) drawn today and your tests are completely normal, you will receive your results only by: MyChart Message (if you have MyChart) OR A paper copy in the mail If you have any lab test that is abnormal or we need to change your treatment, we will call you to review the results.   Testing/Procedures: Your physician has recommended that you have a pulmonary function test. Pulmonary Function Tests are a group of tests that measure how well air moves in and out of your lungs.    Follow-Up: At [facility], you and your health needs are our priority.  As part of our continuing mission to provide you with exceptional heart care, we have created designated Provider Care Teams.  These Care Teams include your primary Cardiologist (physician) and Advanced Practice Providers (APPs -  Physician Assistants and Nurse Practitioners) who all work together to provide you with the care you need, when you need it.  We recommend signing up for the patient portal called "MyChart".  Sign up information is provided on this After Visit Summary.  MyChart is used to connect with patients for Virtual Visits (Telemedicine).  Patients are able to view lab/test results, encounter notes, upcoming appointments, etc.  Non-urgent messages can be sent to your provider as well.   To learn more about what you can do with MyChart, go to [email or web address].    Your next appointment:   6 months with Dr Graciela Husbands  Other Instructions Recommended salt supplementation.  The goal is about 7-10 g of sodium, not sodium chloride, a day.  Salt supplements include oral ThermaTabs, buffered sodium  preparation, SaltStick Vitassium,  Other options include NUUN.  This comes in pill form and is dissolved in liquids.  Other liquid preparations include liquid IV, Pedialyte advance care, TRI-oral Also discussed the role of compression wear including thigh sleeves and abdominal binders.  Calf compression is not specifically recommended.Marland Kitchen

## 2023-02-01 NOTE — Progress Notes (Signed)
Patient Care Team: Enid Baas, MD as PCP - General (Internal Medicine) Duke Salvia, MD as PCP - Electrophysiology (Cardiology)   HPI  Ariel Bryant is a 52 y.o. female Seen in follow-up for symptoms consistent with dysautonomia and PVCs. She's had significant GI symptoms has been seen at  [facility] who felt GI symptoms were secondary to dysautonomia 2019  While being hooked up to the electrocardiogram she had sinus slowing and sinus arrest.  This occurred on a second occasion. Symptomatic PVC/PACs rx flecainide >> propafenone now stopped and taking inderal prn,   Event recorder 5/24 triggered events were most consistent with sinus rhythm with rates up to 125.  Short nonsustained atrial tachycardia was not sensed.  Average heart rate was 95  Interval ER visit 10/24 with "tearing chest pain " was referred to [facility] cardiology.  Treadmill testing was done but results are not available there is also discordant information in the medical record as to whether it was nuclear testing or not.   The patient denies chest pain, nocturnal dyspnea, orthopnea.  There have been no syncope.  Complains of dyspnea on exertion with tachypalpitations.  Some lightheadedness.  Also notes that she has edema in her hands and face in the morning.  (Urine protein was negative).  Symptoms of tachypalpitations and dyspnea are worse since having contracted COVID over the summer and have persisted    Date Cr K Mg Hgb TSH  3/19  3.1     4/19 0.84 4.2 2.3 13.4 0.584  11/20  0.72 3.5   1.5  3/21 0.62 3.9   1.5  11/23 0.7 3.7  12.4 1.2  10/24 0.7 3.6  13.4 1.5    DATE TEST EF   3/19 Echo   60-65 %   5/21 CaScore   % Score = 0  10/24 CTA  Normal Aorta  11/24 stress  Neg   11/24 Echo  55-65%       Complaints of chest pain prompted calcium scoring 5/21>>0  Records and Results Reviewed. As above   Past Medical History:  Diagnosis Date   Anxiety    GERD (gastroesophageal reflux disease)     PTSD (post-traumatic stress disorder)    Sinus tachycardia     Past Surgical History:  Procedure Laterality Date   BREAST BIOPSY Left 12/28/2016   Affirm Biopsy- Fibrocystic changes. RIbbon clip   CESAREAN SECTION     NSD x 3 followed by C/S for 8 + pounds breech   CHOLECYSTECTOMY     LAPAROSCOPIC OOPHERECTOMY     VAGINAL HYSTERECTOMY     had BTL, then VH, later developed ovarian mass and had ooph at 52 yo    Current Outpatient Medications  Medication Sig Dispense Refill   acetaminophen (TYLENOL) 325 MG tablet Take 650 mg by mouth as needed for mild pain, moderate pain, fever or headache.      acyclovir (ZOVIRAX) 400 MG tablet Take 400 mg by mouth as needed (AS NEEDED FOR COLD SORES).      Ascorbic Acid (VITAMIN C) 1000 MG tablet Take 1,000 mg by mouth daily.     Cholecalciferol (D3 ADULT PO) Take 1,000 Units by mouth daily.     clonazePAM (KLONOPIN) 2 MG tablet 2 (two) times daily as needed.     fluticasone (FLONASE) 50 MCG/ACT nasal spray Place 1 spray into both nostrils as needed. For Eustachian Tube Dysfunction     magnesium oxide (MAG-OX) 400 MG tablet TAKE 1 TABLET  BY MOUTH EVERY DAY 90 tablet 0   montelukast (SINGULAIR) 10 MG tablet TAKE 1 TABLET BY MOUTH DAILY FOR ASTHMA AND ALLERGIES     omeprazole (PRILOSEC) 40 MG capsule Take 40 mg by mouth daily.     potassium chloride (KLOR-CON) 10 MEQ tablet Take 10 mEq by mouth daily.     propranolol (INDERAL) 10 MG tablet Take 1 tablet (10 mg) by mouth every 8 hours as needed for tachy palpitations 30 tablet 1   zinc gluconate 50 MG tablet Take 50 mg by mouth daily.     No current facility-administered medications for this visit.    Allergies  Allergen Reactions   Codeine Shortness Of Breath   Dilaudid  [Hydromorphone Hcl] Tinitus   Sulfa Antibiotics Anaphylaxis and Other (See Comments)   Ciprofloxacin Other (See Comments)   Doxycycline Monohydrate Other (See Comments)   Doxycycline Rash      Review of Systems  negative except from HPI and PMH  Physical Exam BP 101/68 (BP Location: Left Arm, Patient Position: Sitting)   Pulse 100   Ht 5\' 4"  (1.626 m)   Wt 182 lb 9.6 oz (82.8 kg)   SpO2 95%   BMI 31.34 kg/m  Well developed and nourished in no acute distress HENT normal Neck supple with JVP-  flat  Clear Regular rate and rhythm, no murmurs or gallops Abd-soft with active BS No Clubbing cyanosis edema Skin-warm and dry A & Oriented  Grossly normal sensory and motor function  ECG sinus at 100 15/07/35   Assessment and  Plan  Dysautonomia  Tachy palpitations-sustained  PVCs/PACs  Palpitations  Monitor>> PACs but no PVCs  Psychosocial stress/PTSD   Sinus Arrest and syncope  Orthostatic Hypotension  Hypertension-predominantly diastolic  Interval COVID  Post COVID dyspnea  Orthostatic vital signs today were notable for delta heart rate of just over 20 but no change in blood pressure description of her treadmill suggests there may have been an exuberant heart rate response consistent with dysautonomia and this occurred temporally related to a COVID infection.  Unfortunately certainly we see this.  We discussed extensively the issues of dysautonomia, the physiology of orthstasis and positional stress.  We discussed the role of salt and water repletion, the importance of exercise, often needing to be started in the recumbent position, and the awareness of triggers and the role of ambient heat and dehydration  Discussed salt substitutes with a  goal of 3-4 gms of Sodium or 12-15 gm of sodium Chloride daily.  Rehydration solutions include Liquid IV, NUUN, TriOral, Normralyte pedialyte advanced care and Banana Bags.  Salt tablets include plain salt tablets, SaltStick Vitassium, thermatabs amongst others.   The higher sodium concentration per serving on this list is Within You hydration formula also at 1000 mg, normalyte about  800 mg of sodium per serving LMNT1000 mg and most recently  about  Salt supplements are best used with adjunctive sugar  Also discussed the role of compression wear including thigh sleeves and abdominal binders.  Calf compression is not specifically recommended  With post-COVID dyspnea, we will undertake pulmonary function testing.  She had a portable x-ray done at [facility] 10/24 that was unremarkable.  The one done at [facility] is not available for report   .Marland Kitchen

## 2023-02-02 ENCOUNTER — Encounter: Payer: Self-pay | Admitting: Internal Medicine

## 2023-02-16 ENCOUNTER — Other Ambulatory Visit: Payer: Self-pay | Admitting: Certified Nurse Midwife

## 2023-02-16 DIAGNOSIS — N6325 Unspecified lump in the left breast, overlapping quadrants: Secondary | ICD-10-CM

## 2023-02-22 ENCOUNTER — Telehealth: Payer: Self-pay | Admitting: Internal Medicine

## 2023-02-22 DIAGNOSIS — R0609 Other forms of dyspnea: Secondary | ICD-10-CM

## 2023-02-22 NOTE — Addendum Note (Signed)
Addended by: Alois Cliche on: 02/22/2023 02:41 PM   Modules accepted: Orders

## 2023-02-22 NOTE — Addendum Note (Signed)
Addended by: Alois Cliche on: 02/22/2023 02:46 PM   Modules accepted: Orders

## 2023-02-22 NOTE — Telephone Encounter (Signed)
Patient returned RN's call and stated can reach her at [phone] after 1:30 pm.

## 2023-02-22 NOTE — Telephone Encounter (Signed)
Attempted phone call to pt and left voicemail message to contact RN at [phone]. 

## 2023-02-22 NOTE — Telephone Encounter (Signed)
Patient stated she will need the referral from Dr. Graciela Husbands sent to Dr. Karna Christmas at [facility] as soon as possible.  Patient stated she has an appointment already scheduled on Wednesday, 1/22.

## 2023-02-22 NOTE — Telephone Encounter (Signed)
Pt is requesting PFT's be completed with Dr Karna Christmas.  Referral placed.

## 2023-03-05 ENCOUNTER — Ambulatory Visit
Admission: RE | Admit: 2023-03-05 | Discharge: 2023-03-05 | Disposition: A | Discharge status: Home / Self Care | Payer: Managed Care, Other (non HMO) | Source: Ambulatory Visit | Attending: Certified Nurse Midwife | Admitting: Certified Nurse Midwife

## 2023-03-05 DIAGNOSIS — N6325 Unspecified lump in the left breast, overlapping quadrants: Secondary | ICD-10-CM

## 2023-05-03 ENCOUNTER — Ambulatory Visit
Admission: RE | Admit: 2023-05-03 | Discharge: 2023-05-03 | Disposition: A | Discharge status: Home / Self Care | Source: Ambulatory Visit | Attending: Student | Admitting: Student

## 2023-05-03 ENCOUNTER — Other Ambulatory Visit: Payer: Self-pay | Admitting: Student

## 2023-05-03 DIAGNOSIS — M79661 Pain in right lower leg: Secondary | ICD-10-CM | POA: Diagnosis present

## 2023-05-04 ENCOUNTER — Encounter: Payer: Self-pay | Admitting: Student

## 2023-05-09 ENCOUNTER — Encounter: Payer: Self-pay | Admitting: Internal Medicine

## 2023-05-13 ENCOUNTER — Ambulatory Visit: Admitting: Internal Medicine

## 2023-05-25 ENCOUNTER — Inpatient Hospital Stay: Payer: Managed Care, Other (non HMO) | Attending: Oncology | Admitting: Licensed Clinical Social Worker

## 2023-05-25 ENCOUNTER — Inpatient Hospital Stay: Payer: Managed Care, Other (non HMO)

## 2023-05-25 NOTE — Progress Notes (Deleted)
 REFERRING PROVIDER: Collin Deal, CNM [address] [place],  [place] [address]  PRIMARY PROVIDER:  Rex Castor, MD  PRIMARY REASON FOR VISIT:  No diagnosis found.   HISTORY OF PRESENT ILLNESS:   Ms. Arriola, a 53 y.o. female, was seen for a [facility] cancer genetics consultation at the request of Collin Deal, CNM due to a family history of cancer.  Ms. Veach presents to clinic today to discuss the possibility of a hereditary predisposition to cancer, genetic testing, and to further clarify her future cancer risks, as well as potential cancer risks for family members.   CANCER HISTORY:  Ms. Treloar is a 53 y.o. female with no personal history of cancer.    RISK FACTORS:  Menarche was at age 74.  First live birth at age ***.  OCP use for approximately {Numbers 1-12 multi-select:20307} years.  Ovaries intact: no.  Hysterectomy: yes.  HRT use: {Numbers 1-12 multi-select:20307} years. Colonoscopy: {Yes/No-Ex:120004}; {normal/abnormal/not examined:14677}. Mammogram within the last year: yes. Number of breast biopsies: 1.   Past Medical History:  Diagnosis Date   Anxiety    GERD (gastroesophageal reflux disease)    PTSD (post-traumatic stress disorder)    Sinus tachycardia     Past Surgical History:  Procedure Laterality Date   BREAST BIOPSY Left 12/28/2016   Affirm Biopsy- Fibrocystic changes. RIbbon clip   CESAREAN SECTION     NSD x 3 followed by C/S for 8 + pounds breech   CHOLECYSTECTOMY     LAPAROSCOPIC OOPHERECTOMY     VAGINAL HYSTERECTOMY     had BTL, then VH, later developed ovarian mass and had ooph at 53 yo    FAMILY HISTORY:  We obtained a detailed, 4-generation family history.  Significant diagnoses are listed below: Family History  Problem Relation Age of Onset   Hypertension Father    Stroke Father    Diabetes Father    Stroke Sister    Breast cancer Maternal Aunt 30   Breast cancer Paternal Aunt    Breast cancer Paternal  Aunt     Ms. Kubicki is unaware of previous family history of genetic testing for hereditary cancer risks. There is no reported Ashkenazi Jewish ancestry. There is no known consanguinity.  GENETIC COUNSELING ASSESSMENT: Ms. Godfrey is a 53 y.o. female with a family history of breast cancer which is somewhat suggestive of a hereditary cancer syndrome and predisposition to cancer. We, therefore, discussed and recommended the following at today's visit.   DISCUSSION: We discussed that approximately 10% of breast cancer is hereditary. Most cases of hereditary breast cancer are associated with BRCA1/2 genes, although there are other genes associated with hereditary cancer as well. Cancers and risks are gene specific. We discussed that testing is beneficial for several reasons including knowing about cancer risks, identifying potential screening and risk-reduction options that may be appropriate, and to understand if other family members could be at risk for cancer and allow them to undergo genetic testing.   We reviewed the characteristics, features and inheritance patterns of hereditary cancer syndromes. We also discussed genetic testing, including the appropriate family members to test, the process of testing, insurance coverage and turn-around-time for results. We discussed the implications of a negative, positive and/or variant of uncertain significant result. We recommended Ms. Mauss pursue genetic testing for the [facility] CancerNext+RNA gene panel.   Based on Ms. Doukas's family history of cancer, she meets medical criteria for genetic testing. Though Ms. Knipfer is not personally affected, there are no affected family  members that are willing/able to undergo hereditary cancer testing.  Therefore, Ms. Wilkersonis the most informative family member available. Despite that she meets criteria, she may still have an out of pocket cost.   We discussed that some people do not want to undergo  genetic testing due to fear of genetic discrimination.  A federal law called the Genetic Information Non-Discrimination Act (GINA) of 2008 helps protect individuals against genetic discrimination based on their genetic test results.  It impacts both health insurance and employment.  For health insurance, it protects against increased premiums, being kicked off insurance or being forced to take a test in order to be insured.  For employment it protects against hiring, firing and promoting decisions based on genetic test results.  Health status due to a cancer diagnosis is not protected under GINA.  This law does not protect life insurance, disability insurance, or other types of insurance.   PLAN: After considering the risks, benefits, and limitations, Ms. Kattner provided informed consent to pursue genetic testing and the blood sample was sent to [facility] for analysis of the CancerNext+RNA panel. Results should be available within approximately 2-3 weeks' time, at which point they will be disclosed by telephone to Ms. Gethers, as will any additional recommendations warranted by these results. Ms. Guzek will receive a summary of her genetic counseling visit and a copy of her results once available. This information will also be available in Epic.   Ms. Blizard questions were answered to her satisfaction today. Our contact information was provided should additional questions or concerns arise. Thank you for the referral and allowing us  to share in the care of your patient.   Valri Gee, MS, [facility] Genetic Counselor [email or web address] Phone: [phone]  *** minutes were spent on the date of the encounter in service to the patient including preparation, face-to-face consultation, documentation and care coordination. Dr. Nelson Bandy was available for discussion regarding this case.   _______________________________________________________________________ For Office  Staff:  Number of people involved in session: *** Was an Intern/ student involved with case: no

## 2023-06-08 ENCOUNTER — Encounter: Payer: Self-pay | Admitting: Obstetrics and Gynecology

## 2023-06-08 ENCOUNTER — Ambulatory Visit: Admitting: Obstetrics and Gynecology

## 2023-06-08 VITALS — BP 140/91 | HR 82 | Ht 64.5 in | Wt 186.0 lb

## 2023-06-08 DIAGNOSIS — R350 Frequency of micturition: Secondary | ICD-10-CM | POA: Diagnosis not present

## 2023-06-08 DIAGNOSIS — N952 Postmenopausal atrophic vaginitis: Secondary | ICD-10-CM

## 2023-06-08 DIAGNOSIS — N8110 Cystocele, unspecified: Secondary | ICD-10-CM

## 2023-06-08 DIAGNOSIS — M6289 Other specified disorders of muscle: Secondary | ICD-10-CM

## 2023-06-08 DIAGNOSIS — N3281 Overactive bladder: Secondary | ICD-10-CM

## 2023-06-08 DIAGNOSIS — M62838 Other muscle spasm: Secondary | ICD-10-CM

## 2023-06-08 LAB — POCT URINALYSIS DIPSTICK
Bilirubin, UA: NEGATIVE
Blood, UA: NEGATIVE
Glucose, UA: NEGATIVE
Ketones, UA: NEGATIVE
Leukocytes, UA: NEGATIVE
Nitrite, UA: NEGATIVE
Protein, UA: NEGATIVE
Spec Grav, UA: 1.01 (ref 1.010–1.025)
Urobilinogen, UA: 0.2 U/dL
pH, UA: 7 (ref 5.0–8.0)

## 2023-06-08 MED ORDER — TROSPIUM CHLORIDE ER 60 MG PO CP24: 60.0000 mg | ORAL_CAPSULE | Freq: Every day | ORAL | 5 refills | Status: AC

## 2023-06-08 MED ORDER — DIAZEPAM 5 MG PO TABS: ORAL_TABLET | ORAL | 0 refills | Status: AC

## 2023-06-08 MED ORDER — ESTRADIOL 0.1 MG/GM VA CREA: 0.5000 g | TOPICAL_CREAM | VAGINAL | 11 refills | Status: AC

## 2023-06-08 NOTE — Patient Instructions (Signed)
 Your obturator muscles are very tight and tender. I want you to see about a pelvic floor wand and see about massaging these muscles. We are also going to do a short course of vaginal valium and see if this helps your pelvic floor spasms and rectal spasms.   We will start with vaginal estrogen cream nightly for 2 weeks. After the first two weeks go down to three times weekly. You want to put this inside the vagina and around the urethra.   For lubrication you can continue coconut oil or you can try some of the lubrication samples from today.   For bowel movements, please get a stool in your home to help bring your knees up. Take a deep breath in and blow out like blowing through a straw to help relax the pelvic floor for bowel movements.   Start the Trospium 60mg  ER daily for the overactive bladder. I would take this at lunchtime at work so it is peaking at home. Try to track and see if this is helpful for the leakage and frequency.
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
## 2023-06-09 ENCOUNTER — Encounter: Payer: Self-pay | Admitting: Obstetrics and Gynecology

## 2023-06-17 ENCOUNTER — Ambulatory Visit: Admitting: Internal Medicine

## 2023-06-18 ENCOUNTER — Ambulatory Visit: Admitting: Cardiology

## 2023-06-18 ENCOUNTER — Telehealth: Payer: Self-pay | Admitting: Internal Medicine

## 2023-06-18 NOTE — Telephone Encounter (Signed)
 Called patient, she states she has noticed when over a period of time her blood pressure increasing. She states her blood pressure this morning before medications was 127/94 HR 79. She states last night it was 133/100 (no hr) she did mention she felt bad at work, took an extra propranolol  (this is for palpitations/tachy). We did discuss how the she should also monitor her HR as well, because the propranolol  will drop HR lower which could make her feel worse as well. Patient only has symptoms of a headache and feeling tired, she states her HR has been all over the place at times, in the 40's and upper 130's.   Patient states she does not check her BP only when she feels bad. However I did advise patient to start checking a blood pressure and HR log, monitoring those readings and bring with her to her visit on 05/30. Patient verbalized understanding of this.   Advised I would route to NP seeing patient soon and I would call back with any recommendations.

## 2023-06-18 NOTE — Telephone Encounter (Signed)
 Pt c/o BP issue: STAT if pt c/o blurred vision, one-sided weakness or slurred speech.  STAT if BP is GREATER than 180/120 TODAY.  STAT if BP is LESS than 90/60 and SYMPTOMATIC TODAY  1. What is your BP concern?   Patient is concerned her BP has been trending high  2. Have you taken any BP medication today?  Not taken as yet today  3. What are your last 5 BP readings?  This morning 127/94 Last night 133/100  4. Are you having any other symptoms (ex. Dizziness, headache, blurred vision, passed out)?   Headache at base of skull   Patient wants to know if she should take additional propranolol  (INDERAL ) 10 MG tablet.

## 2023-06-18 NOTE — Telephone Encounter (Signed)
Called patient, advised of message below. Patient verbalized understanding.  

## 2023-06-22 ENCOUNTER — Encounter (INDEPENDENT_AMBULATORY_CARE_PROVIDER_SITE_OTHER): Payer: Self-pay

## 2023-06-25 ENCOUNTER — Ambulatory Visit (INDEPENDENT_AMBULATORY_CARE_PROVIDER_SITE_OTHER): Payer: Self-pay

## 2023-06-25 DIAGNOSIS — I7090 Unspecified atherosclerosis: Secondary | ICD-10-CM | POA: Diagnosis not present

## 2023-06-29 LAB — VAS US ABI WITH/WO TBI
Left ABI: 1.26
Right ABI: 1.21

## 2023-07-02 ENCOUNTER — Ambulatory Visit: Admitting: Cardiology

## 2023-07-23 ENCOUNTER — Ambulatory Visit: Admitting: Obstetrics and Gynecology

## 2023-07-31 ENCOUNTER — Encounter: Payer: Self-pay | Admitting: Obstetrics and Gynecology

## 2023-08-11 ENCOUNTER — Telehealth: Admitting: Obstetrics and Gynecology

## 2023-09-03 LAB — COLOGUARD: COLOGUARD: NEGATIVE

## 2023-10-25 ENCOUNTER — Telehealth: Admitting: Obstetrics and Gynecology

## 2023-12-15 ENCOUNTER — Encounter (INDEPENDENT_AMBULATORY_CARE_PROVIDER_SITE_OTHER): Payer: Self-pay

## 2024-02-14 ENCOUNTER — Encounter: Payer: Self-pay | Admitting: *Deleted
